# Patient Record
Sex: Female | Born: 1965 | Race: White | Hispanic: No | Smoking: Never smoker
Health system: Southern US, Community
[De-identification: ages and names within clinical notes are randomized; demographics above are authoritative.]

## PROBLEM LIST (undated history)

## (undated) ENCOUNTER — Ambulatory Visit

## (undated) ENCOUNTER — Encounter
Attending: Student in an Organized Health Care Education/Training Program | Primary: Student in an Organized Health Care Education/Training Program

## (undated) ENCOUNTER — Encounter

## (undated) ENCOUNTER — Ambulatory Visit: Attending: Family Medicine | Primary: Family Medicine

## (undated) ENCOUNTER — Ambulatory Visit: Attending: Pharmacist | Primary: Pharmacist

## (undated) ENCOUNTER — Telehealth

## (undated) ENCOUNTER — Ambulatory Visit: Payer: PRIVATE HEALTH INSURANCE

## (undated) ENCOUNTER — Ambulatory Visit
Payer: PRIVATE HEALTH INSURANCE | Attending: Student in an Organized Health Care Education/Training Program | Primary: Student in an Organized Health Care Education/Training Program

## (undated) ENCOUNTER — Ambulatory Visit: Payer: BLUE CROSS/BLUE SHIELD

## (undated) ENCOUNTER — Ambulatory Visit
Payer: PRIVATE HEALTH INSURANCE | Attending: Rehabilitative and Restorative Service Providers" | Primary: Rehabilitative and Restorative Service Providers"

## (undated) ENCOUNTER — Ambulatory Visit: Payer: PRIVATE HEALTH INSURANCE | Attending: Medical | Primary: Medical

## (undated) ENCOUNTER — Ambulatory Visit: Payer: PRIVATE HEALTH INSURANCE | Attending: Nurse Practitioner | Primary: Nurse Practitioner

## (undated) ENCOUNTER — Encounter: Payer: PRIVATE HEALTH INSURANCE | Attending: Family | Primary: Family

---

## 2005-09-13 ENCOUNTER — Emergency Department: Payer: Self-pay | Admitting: General Practice

## 2005-09-13 ENCOUNTER — Other Ambulatory Visit: Payer: Self-pay

## 2007-08-19 ENCOUNTER — Ambulatory Visit: Payer: Self-pay

## 2008-12-08 ENCOUNTER — Ambulatory Visit: Payer: Self-pay

## 2011-07-11 ENCOUNTER — Ambulatory Visit: Payer: Self-pay

## 2011-08-16 ENCOUNTER — Ambulatory Visit: Payer: Self-pay | Admitting: Family Medicine

## 2014-01-11 IMAGING — MG MM DIGITAL SCREENING BILAT W/ CAD
1 series · 4 of 4 positions shown · non-contrast
Comparison: none

REASON FOR EXAM: screening
COMMENTS:

PROCEDURE:     MAM - MAM [REDACTED] DIG SCREEN MAM W/CAD  - July 11, 2011 [DATE]
RESULT:

[Series 1262: R CC · right · 4 of 4 slices shown]
[im 1/4]
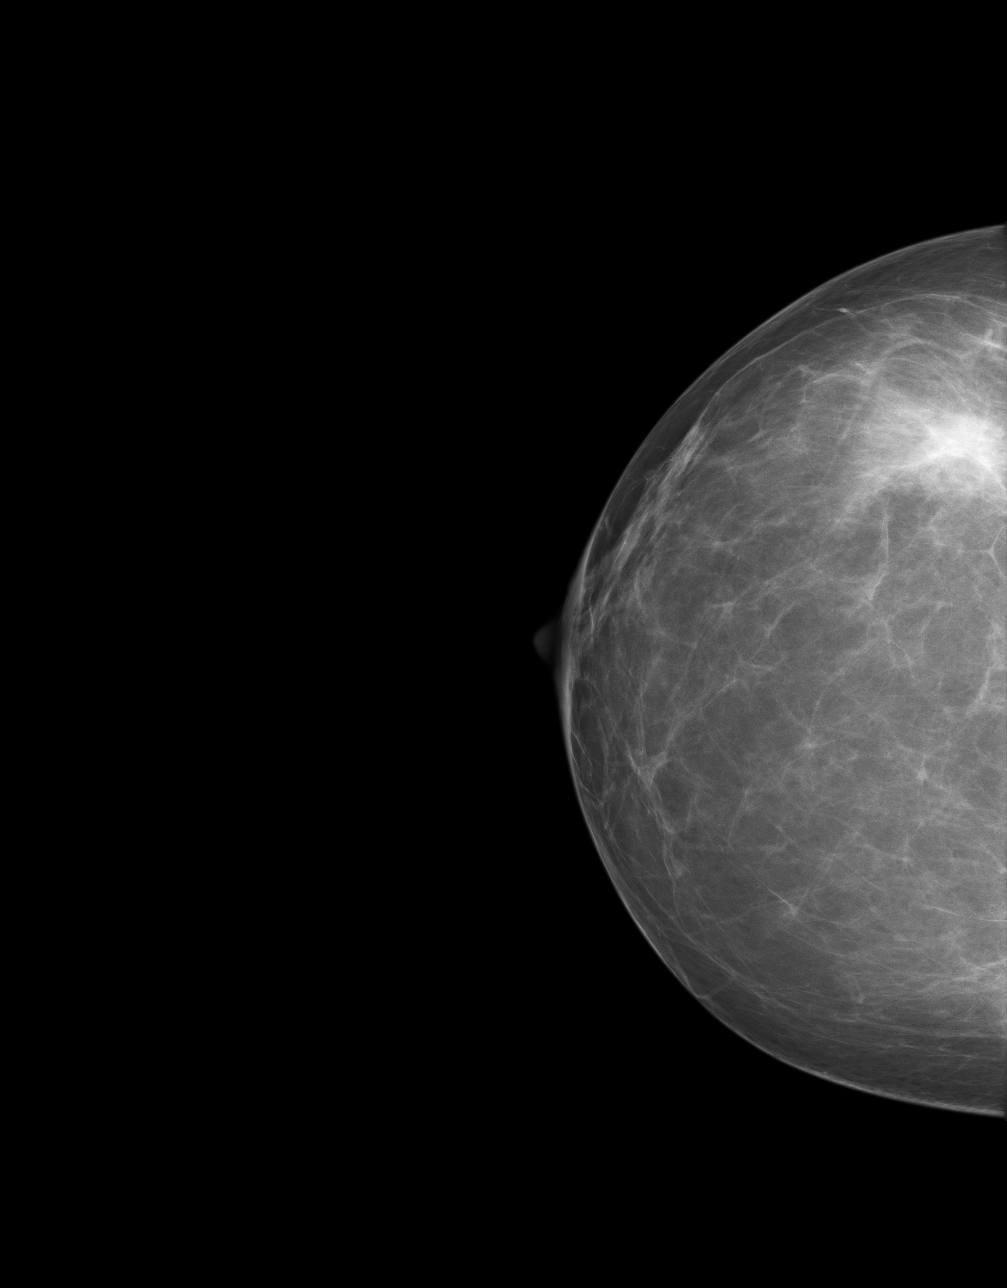
[im 2/4]
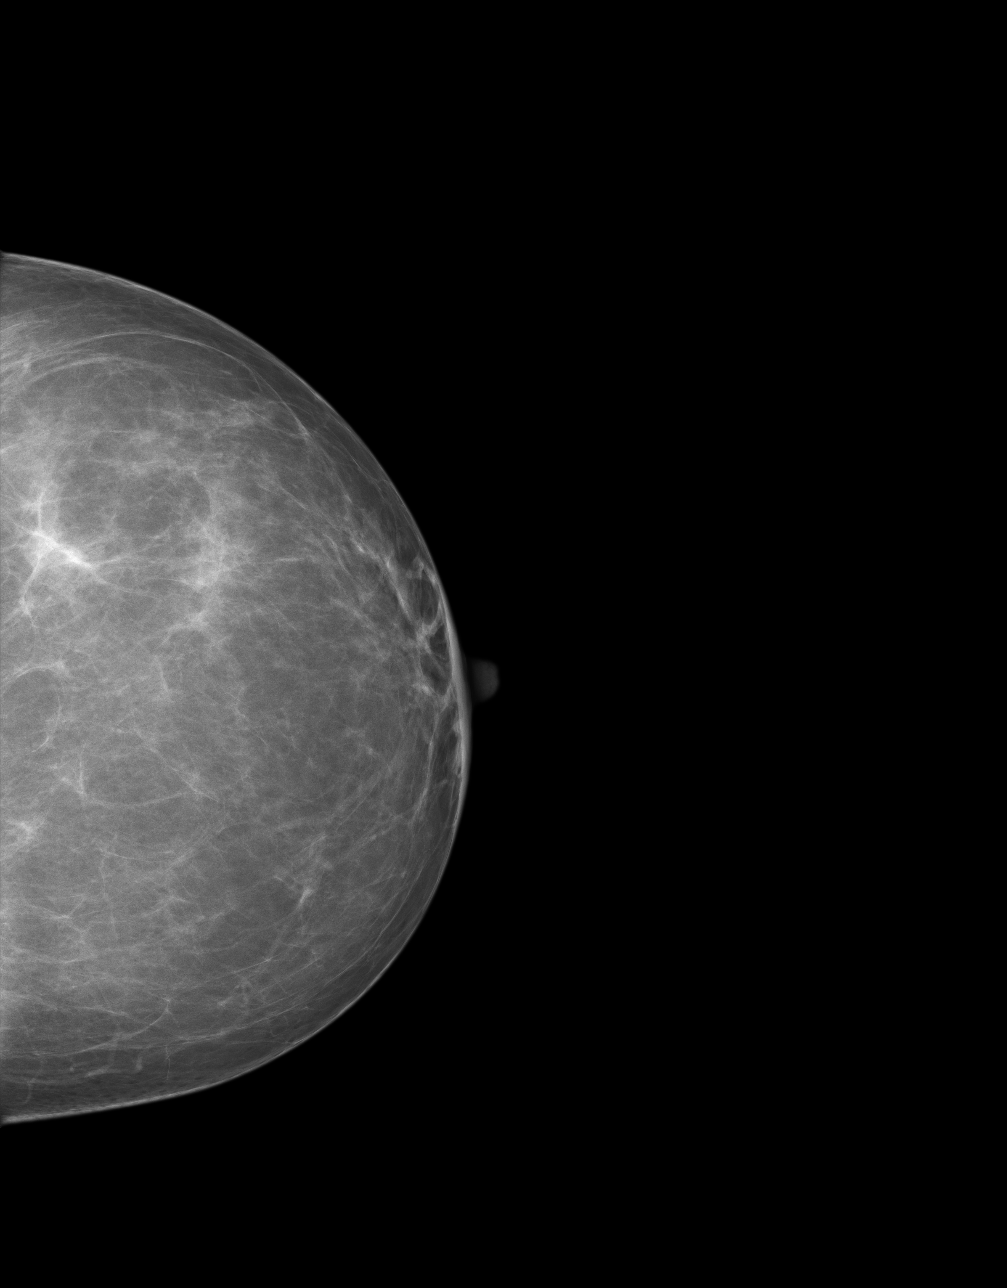
[im 3/4]
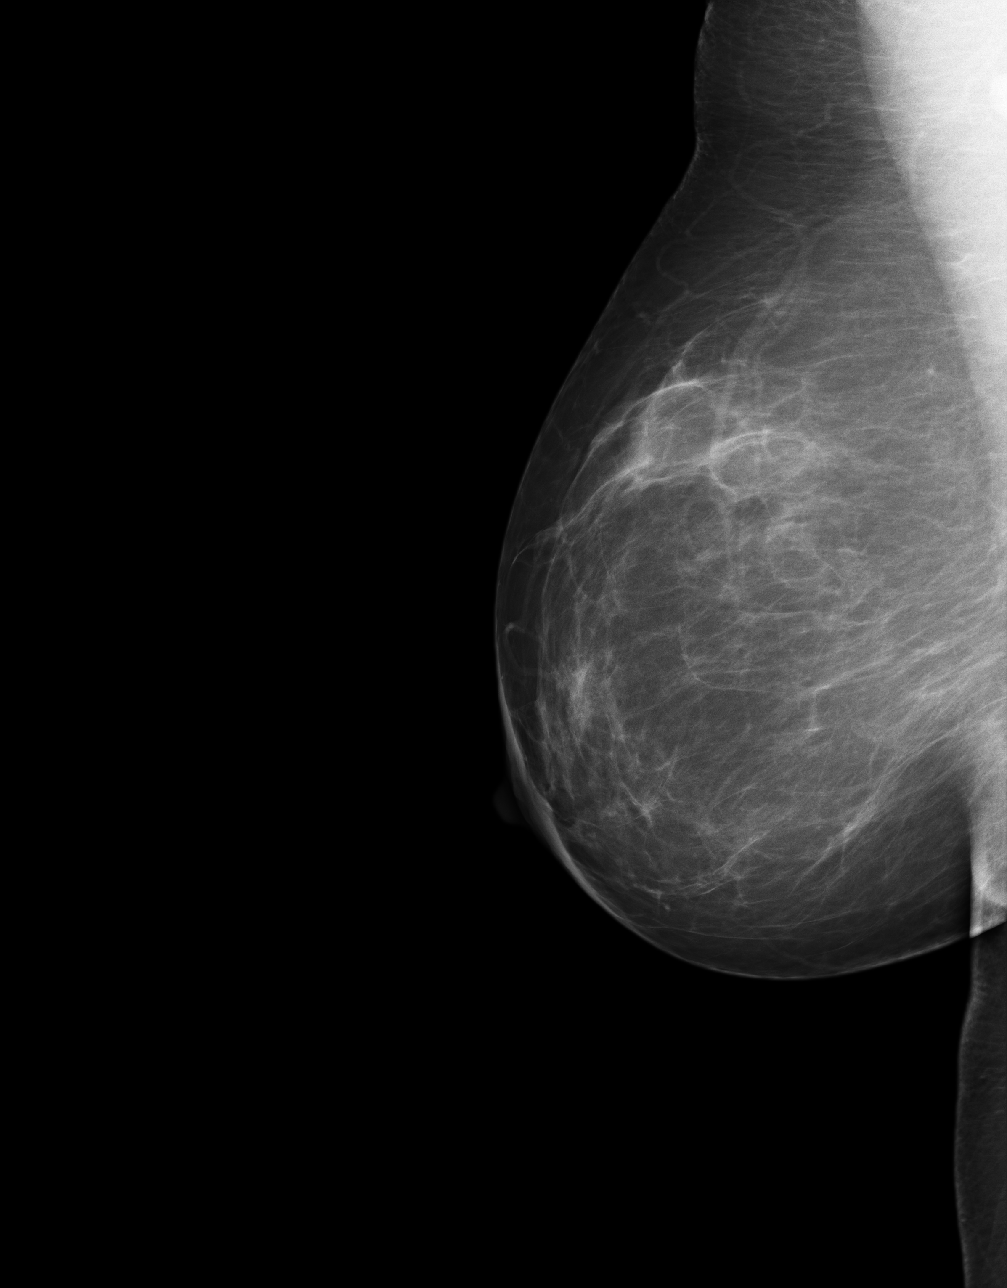
[im 4/4]
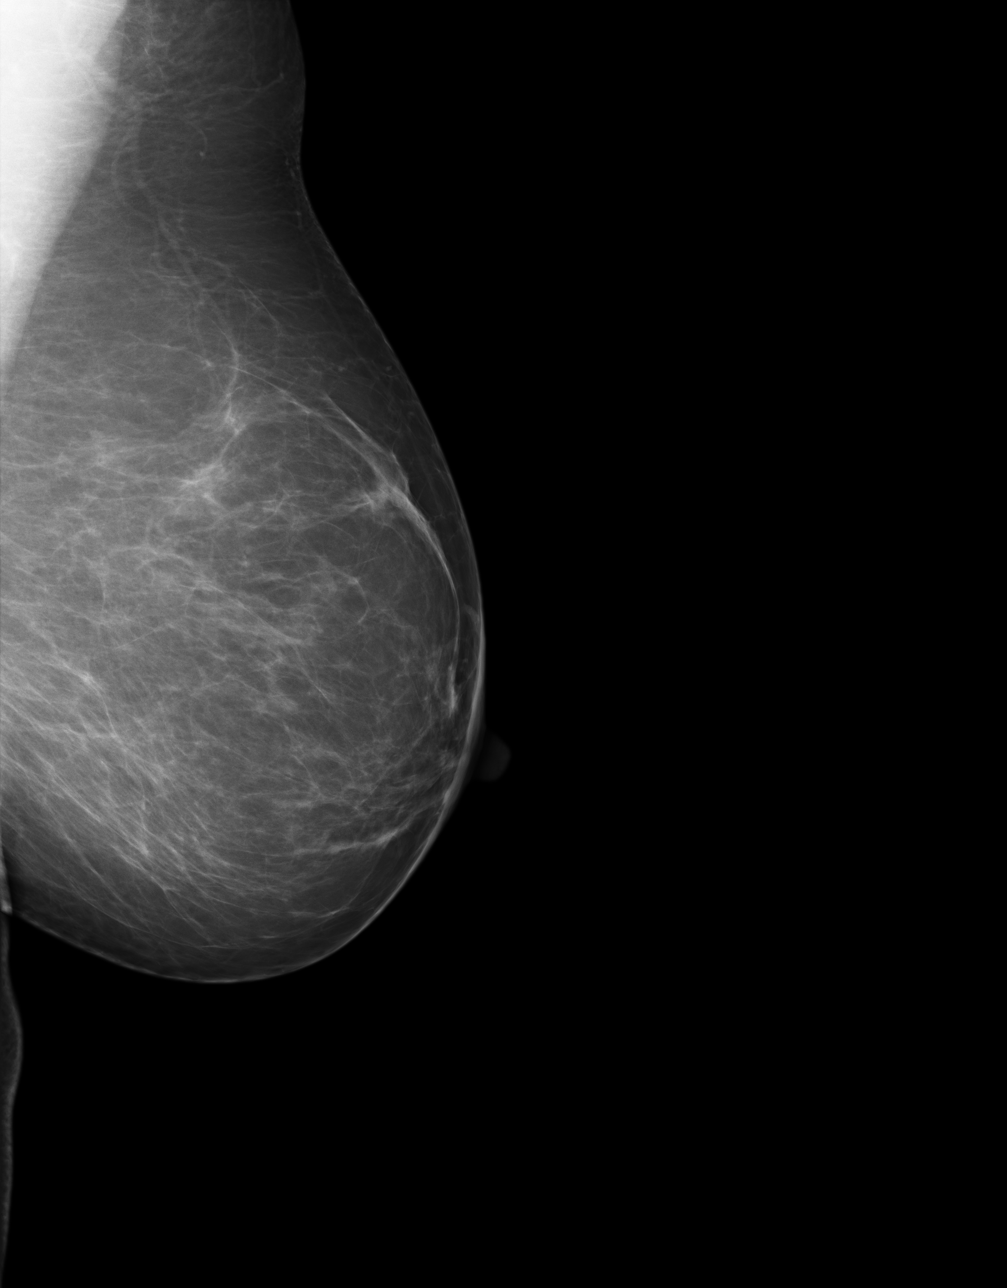

[4 of 4 positions shown; findings below may reference images not displayed]

FINDINGS: There is a family history of breast cancer in the patient's
mother.

Comparison is made to previous digital mammographic images dated 12/08/2008
as well as 08/19/2007.

The breasts exhibit a mildly dense parenchymal pattern without demonstrating
an area of developing parenchymal density or dominant mass. No malignant
appearing calcification or architectural distortion is evident. The
mammographic appearance is stable.
IMPRESSION: Stable, benign appearing bilateral mammogram.

BI-RADS: Category 2 - Benign Findings

RECOMMENDATION:  Please continue to encourage annual mammographic follow-up.

Thank you for this opportunity to contribute to the care of your patient.

A NEGATIVE MAMMOGRAM REPORT DOES NOT PRECLUDE BIOPSY OR OTHER EVALUATION OF
A CLINICALLY PALPABLE OR OTHERWISE SUSPICIOUS MASS OR LESION. BREAST CANCER
MAY NOT BE DETECTED BY MAMMOGRAPHY IN UP TO 10% OF CASES.

## 2015-09-13 ENCOUNTER — Other Ambulatory Visit: Payer: Self-pay | Admitting: Family Medicine

## 2015-09-13 DIAGNOSIS — Z1231 Encounter for screening mammogram for malignant neoplasm of breast: Secondary | ICD-10-CM

## 2015-09-27 ENCOUNTER — Ambulatory Visit
Admission: RE | Admit: 2015-09-27 | Discharge: 2015-09-27 | Disposition: A | Discharge status: Home / Self Care | Payer: PRIVATE HEALTH INSURANCE | Source: Ambulatory Visit | Attending: Family Medicine | Admitting: Family Medicine

## 2015-09-27 DIAGNOSIS — Z1231 Encounter for screening mammogram for malignant neoplasm of breast: Secondary | ICD-10-CM | POA: Diagnosis present

## 2016-01-25 ENCOUNTER — Other Ambulatory Visit: Payer: Self-pay | Admitting: Family Medicine

## 2016-01-25 DIAGNOSIS — R945 Abnormal results of liver function studies: Secondary | ICD-10-CM

## 2016-02-01 ENCOUNTER — Ambulatory Visit
Admission: RE | Admit: 2016-02-01 | Discharge: 2016-02-01 | Disposition: A | Discharge status: Home / Self Care | Payer: PRIVATE HEALTH INSURANCE | Source: Ambulatory Visit | Attending: Family Medicine | Admitting: Family Medicine

## 2016-02-01 DIAGNOSIS — R945 Abnormal results of liver function studies: Secondary | ICD-10-CM | POA: Diagnosis present

## 2016-02-01 DIAGNOSIS — K760 Fatty (change of) liver, not elsewhere classified: Secondary | ICD-10-CM | POA: Diagnosis not present

## 2017-11-05 ENCOUNTER — Ambulatory Visit: Payer: Self-pay

## 2017-12-10 ENCOUNTER — Ambulatory Visit: Payer: Self-pay | Attending: Oncology

## 2018-04-29 ENCOUNTER — Encounter: Payer: Self-pay | Admitting: *Deleted

## 2018-04-29 ENCOUNTER — Ambulatory Visit
Admission: RE | Admit: 2018-04-29 | Discharge: 2018-04-29 | Disposition: A | Discharge status: Home / Self Care | Payer: Self-pay | Source: Ambulatory Visit | Attending: Oncology | Admitting: Oncology

## 2018-04-29 ENCOUNTER — Encounter (INDEPENDENT_AMBULATORY_CARE_PROVIDER_SITE_OTHER): Payer: Self-pay

## 2018-04-29 ENCOUNTER — Ambulatory Visit: Payer: Self-pay | Attending: Oncology | Admitting: *Deleted

## 2018-04-29 VITALS — BP 136/78 | HR 85 | Temp 98.0°F | Ht 65.5 in | Wt 203.5 lb

## 2018-04-29 DIAGNOSIS — Z0000 Encounter for general adult medical examination without abnormal findings: Secondary | ICD-10-CM

## 2018-04-29 NOTE — Patient Instructions (Signed)
Gave patient hand-out, Women Staying Healthy, Active and Well from [facility], with education on breast health, pap smears, heart and colon health. 

## 2018-04-29 NOTE — Progress Notes (Signed)
  Subjective:     Patient ID: Veronica Rodgers, female   DOB: 10/29/1965, 52 y.o.   MRN: [id number]  HPI   Review of Systems     Objective:   Physical Exam Chest:     Breasts:        Right: No swelling, bleeding, inverted nipple, mass, nipple discharge, skin change or tenderness.        Left: No swelling, bleeding, inverted nipple, mass, nipple discharge, skin change or tenderness.  Abdominal:     Palpations: There is no hepatomegaly or splenomegaly.  Genitourinary:    Labia:        Right: No rash, tenderness, lesion or injury.        Left: No rash, tenderness, lesion or injury.            Assessment:     52 year old White female [facility]  Clinical breast exam unremarkable.  Taught self breast awareness.  Specimen collected for pap smear.  Patient has been screened for eligibility.  She does not have any insurance, Medicare or Medicaid.  She also meets financial eligibility.  Hand-out given on the Affordable Care Act. Risk Assessment    Risk Scores      04/29/2018   Last edited by: Alta Corningover, Melissa G, CMA   5-year risk: 2 %   Lifetime risk: 15.3 %            Plan:     Screening mammogram ordered.  Specimen for pap sent to the lab.  Will follow up per [facility] protocol.

## 2018-05-03 LAB — PAP LB AND HPV HIGH-RISK: HPV, high-risk: POSITIVE — AB

## 2018-05-10 ENCOUNTER — Encounter: Payer: Self-pay | Admitting: *Deleted

## 2018-05-10 NOTE — Progress Notes (Addendum)
Patient has abnormal pap of HPV+ LGSIL.  Left patient a message to return my call.  I would like to schedule her to GYN consult for colposcopy and biopsy.  Patient returned my call.  She is scheduled [facility]/18 @ 3:00.

## 2018-05-23 ENCOUNTER — Ambulatory Visit (INDEPENDENT_AMBULATORY_CARE_PROVIDER_SITE_OTHER): Payer: Self-pay | Admitting: Obstetrics and Gynecology

## 2018-05-23 ENCOUNTER — Other Ambulatory Visit (HOSPITAL_COMMUNITY)
Admission: RE | Admit: 2018-05-23 | Discharge: 2018-05-23 | Disposition: A | Discharge status: Home / Self Care | Payer: Self-pay | Source: Ambulatory Visit | Attending: Obstetrics and Gynecology | Admitting: Obstetrics and Gynecology

## 2018-05-23 ENCOUNTER — Encounter: Payer: Self-pay | Admitting: Obstetrics and Gynecology

## 2018-05-23 VITALS — BP 160/82 | Ht 67.0 in | Wt 203.0 lb

## 2018-05-23 DIAGNOSIS — N870 Mild cervical dysplasia: Secondary | ICD-10-CM

## 2018-05-23 DIAGNOSIS — R87612 Low grade squamous intraepithelial lesion on cytologic smear of cervix (LGSIL): Secondary | ICD-10-CM | POA: Insufficient documentation

## 2018-05-23 HISTORY — PX: COLPOSCOPY: SHX161

## 2018-05-23 NOTE — Progress Notes (Signed)
   GYNECOLOGY CLINIC COLPOSCOPY PROCEDURE NOTE  53 y.o. [id number] here for colposcopy for low-grade squamous intraepithelial neoplasia (LGSIL - encompassing HPV,mild dysplasia,CIN I)  pap smear on 04/29/18. Discussed underlying role for HPV infection in the development of cervical dysplasia, its natural history and progression/regression, need for surveillance.  She reports her last pap more than 5 years ago, can not recall exactly. She may of had cryotherapy in the past. Denies a prior LEEP or CONE.  Is the patient  pregnant: No LMP: No LMP recorded. Patient is postmenopausal. Smoking status:  reports that she has never smoked. She has never used smokeless tobacco. Contraception: none Future fertility desired:  No  Patient given informed consent, signed copy in the chart, time out was performed.  The patient was position in dorsal lithotomy position. Speculum was placed the cervix was visualized.   After application of acetic acid colposcopic inspection of the cervix was undertaken.   Colposcopy adequate, full visualization of transformation zone: No acetowhite lesion(s) noted at 11 and 5 o'clock; corresponding biopsies obtained.   ECC specimen obtained:  Yes  All specimens were labeled and sent to pathology.   Patient was given post procedure instructions.  Will follow up pathology and manage accordingly.  Routine preventative health maintenance measures emphasized.  Physical Exam Genitourinary:        Adelene Idler MD [facility], [facility] 05/23/2018 4:39 PM

## 2018-05-28 NOTE — Progress Notes (Signed)
Called and discussed with patient, will need pap smear in 1 year. CIN 1.

## 2018-06-04 ENCOUNTER — Encounter: Payer: Self-pay | Admitting: *Deleted

## 2018-06-13 ENCOUNTER — Ambulatory Visit
Admit: 2018-06-13 | Discharge: 2018-06-14 | Attending: Student in an Organized Health Care Education/Training Program | Primary: Student in an Organized Health Care Education/Training Program

## 2018-06-13 DIAGNOSIS — H04123 Dry eye syndrome of bilateral lacrimal glands: Secondary | ICD-10-CM

## 2018-06-13 DIAGNOSIS — H18603 Keratoconus, unspecified, bilateral: Principal | ICD-10-CM

## 2018-06-24 ENCOUNTER — Ambulatory Visit: Admit: 2018-06-24 | Discharge: 2018-06-25

## 2019-04-15 ENCOUNTER — Other Ambulatory Visit: Payer: Self-pay | Admitting: Family Medicine

## 2019-04-15 DIAGNOSIS — Z1231 Encounter for screening mammogram for malignant neoplasm of breast: Secondary | ICD-10-CM

## 2019-04-28 ENCOUNTER — Encounter: Payer: Self-pay | Admitting: *Deleted

## 2019-04-28 NOTE — Progress Notes (Signed)
Called patient to prescreen for her [facility] appointment.  Left message for her to return my call.

## 2019-04-30 ENCOUNTER — Ambulatory Visit: Payer: Self-pay

## 2019-10-07 ENCOUNTER — Ambulatory Visit: Admit: 2019-10-07 | Discharge: 2019-10-08 | Payer: PRIVATE HEALTH INSURANCE

## 2020-01-17 ENCOUNTER — Ambulatory Visit: Admit: 2020-01-17 | Discharge: 2020-01-19 | Payer: PRIVATE HEALTH INSURANCE

## 2020-01-21 ENCOUNTER — Ambulatory Visit
Admission: RE | Admit: 2020-01-21 | Discharge: 2020-01-21 | Disposition: A | Discharge status: Home / Self Care | Payer: BLUE CROSS/BLUE SHIELD | Source: Ambulatory Visit | Attending: Family Medicine | Admitting: Family Medicine

## 2020-01-21 DIAGNOSIS — Z1231 Encounter for screening mammogram for malignant neoplasm of breast: Secondary | ICD-10-CM | POA: Insufficient documentation

## 2020-01-23 ENCOUNTER — Other Ambulatory Visit: Payer: Self-pay | Admitting: Family Medicine

## 2020-01-23 DIAGNOSIS — N644 Mastodynia: Secondary | ICD-10-CM

## 2020-02-02 ENCOUNTER — Other Ambulatory Visit: Payer: BLUE CROSS/BLUE SHIELD

## 2020-02-09 ENCOUNTER — Other Ambulatory Visit: Payer: BLUE CROSS/BLUE SHIELD

## 2020-02-18 ENCOUNTER — Ambulatory Visit: Payer: BLUE CROSS/BLUE SHIELD | Attending: Family Medicine

## 2020-02-18 ENCOUNTER — Other Ambulatory Visit: Payer: BLUE CROSS/BLUE SHIELD

## 2020-06-23 ENCOUNTER — Other Ambulatory Visit: Payer: BLUE CROSS/BLUE SHIELD

## 2020-07-21 ENCOUNTER — Other Ambulatory Visit: Payer: BLUE CROSS/BLUE SHIELD

## 2020-07-28 DIAGNOSIS — H9203 Otalgia, bilateral: Principal | ICD-10-CM

## 2020-10-08 ENCOUNTER — Other Ambulatory Visit: Payer: BLUE CROSS/BLUE SHIELD

## 2020-10-08 ENCOUNTER — Inpatient Hospital Stay: Admission: RE | Admit: 2020-10-08 | Payer: BLUE CROSS/BLUE SHIELD | Source: Ambulatory Visit

## 2020-11-26 ENCOUNTER — Encounter: Admit: 2020-11-26 | Payer: PRIVATE HEALTH INSURANCE | Attending: Audiologist | Primary: Audiologist

## 2020-12-01 ENCOUNTER — Other Ambulatory Visit: Payer: BLUE CROSS/BLUE SHIELD

## 2020-12-01 ENCOUNTER — Inpatient Hospital Stay: Admission: RE | Admit: 2020-12-01 | Payer: BLUE CROSS/BLUE SHIELD | Source: Ambulatory Visit

## 2020-12-14 ENCOUNTER — Ambulatory Visit
Admission: RE | Admit: 2020-12-14 | Discharge: 2020-12-14 | Disposition: A | Discharge status: Home / Self Care | Payer: BLUE CROSS/BLUE SHIELD | Source: Ambulatory Visit | Attending: Family Medicine | Admitting: Family Medicine

## 2020-12-14 ENCOUNTER — Other Ambulatory Visit: Payer: Self-pay

## 2020-12-14 DIAGNOSIS — N644 Mastodynia: Secondary | ICD-10-CM

## 2020-12-31 DIAGNOSIS — L409 Psoriasis, unspecified: Principal | ICD-10-CM

## 2020-12-31 DIAGNOSIS — M2560 Stiffness of unspecified joint, not elsewhere classified: Principal | ICD-10-CM

## 2020-12-31 DIAGNOSIS — H9190 Unspecified hearing loss, unspecified ear: Principal | ICD-10-CM

## 2020-12-31 DIAGNOSIS — R5383 Other fatigue: Principal | ICD-10-CM

## 2021-01-13 ENCOUNTER — Ambulatory Visit: Admit: 2021-01-13 | Discharge: 2021-01-14 | Payer: PRIVATE HEALTH INSURANCE

## 2021-01-13 DIAGNOSIS — M545 Chronic bilateral low back pain without sciatica: Principal | ICD-10-CM

## 2021-01-13 DIAGNOSIS — G8929 Other chronic pain: Principal | ICD-10-CM

## 2021-01-13 MED ORDER — METHOCARBAMOL 500 MG TABLET
ORAL_TABLET | Freq: Every evening | ORAL | 0 refills | 30 days | Status: CP | PRN
Start: 2021-01-13 — End: ?

## 2021-02-24 ENCOUNTER — Ambulatory Visit
Admit: 2021-02-24 | Payer: PRIVATE HEALTH INSURANCE | Attending: Audiologist-Hearing Aid Fitter | Primary: Audiologist-Hearing Aid Fitter

## 2021-03-09 ENCOUNTER — Ambulatory Visit: Admit: 2021-03-09 | Discharge: 2021-03-09 | Payer: PRIVATE HEALTH INSURANCE

## 2021-03-09 ENCOUNTER — Ambulatory Visit
Admit: 2021-03-09 | Discharge: 2021-03-09 | Payer: PRIVATE HEALTH INSURANCE | Attending: Student in an Organized Health Care Education/Training Program | Primary: Student in an Organized Health Care Education/Training Program

## 2021-03-09 DIAGNOSIS — L409 Psoriasis, unspecified: Principal | ICD-10-CM

## 2021-03-09 DIAGNOSIS — L4050 Arthropathic psoriasis, unspecified: Principal | ICD-10-CM

## 2021-03-09 DIAGNOSIS — Z79899 Other long term (current) drug therapy: Principal | ICD-10-CM

## 2021-03-09 MED ORDER — BETAMETHASONE DIPROPIONATE 0.05 % TOPICAL OINTMENT
Freq: Two times a day (BID) | TOPICAL | 2 refills | 0.00000 days | Status: CP | PRN
Start: 2021-03-09 — End: 2022-03-09

## 2021-03-11 DIAGNOSIS — L409 Psoriasis, unspecified: Principal | ICD-10-CM

## 2021-03-11 MED ORDER — HUMIRA PEN CITRATE FREE STARTER PACK FOR PS/UV 1X 80 MG/0.8 ML, 2X 40 MG/0.4 ML
ORAL | 1 refills | 0.00000 days | Status: CP
Start: 2021-03-11 — End: ?

## 2021-03-14 DIAGNOSIS — L409 Psoriasis, unspecified: Principal | ICD-10-CM

## 2021-03-14 MED ORDER — HUMIRA PEN CITRATE FREE 40 MG/0.4 ML
SUBCUTANEOUS | 3 refills | 28.00000 days | Status: CP
Start: 2021-03-14 — End: ?
  Filled 2021-03-29: qty 3, 30d supply, fill #0

## 2021-03-15 DIAGNOSIS — L409 Psoriasis, unspecified: Principal | ICD-10-CM

## 2021-03-25 NOTE — Unmapped (Signed)
[facility] Specialty Medication Onboarding    Specialty Medication: Humira starter kit and maintenance  Prior Authorization: Approved   Financial Assistance: Yes - copay card approved as secondary   Final Copay/Day Supply: $5 / 30 days (load) $5/ 28 days (maintenance)     Insurance Restrictions: Yes - max 1 month supply     Notes to Pharmacist:     The triage team has completed the benefits investigation and has determined that the patient is able to fill this medication at [facility]. Please contact the patient to complete the onboarding or follow up with the prescribing physician as needed.

## 2021-03-25 NOTE — Unmapped (Signed)
*in addition to reviewing the information below, I also sent a link to an injection training video to the patient's MyChart.    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  I am speaking to the patient.    Was a Nurse, learning disability used for this call? No    Verified patient's date of birth / HIPAA.    Specialty medication(s) to be sent: Inflammatory Disorders: Humira      Non-specialty medications/supplies to be sent: Sharps container      Medications not needed at this time: na         Humira (adalimumab)    Medication & Administration     Dosage:  Psoriasis: Inject 80mg  under the skin on day 1, then 40mg  every 14 days starting on day 8    Lab tests required prior to treatment initiation:  ??? Tuberculosis: Tuberculosis screening resulted in a non-reactive Quantiferon TB Gold assay.  ??? Hepatitis B: Hepatitis B serology studies are complete and non-reactive.    Administration:     Prefilled auto-injector pen  1. Gather all supplies needed for injection on a clean, flat working surface: medication pen removed from packaging, alcohol swab, sharps container, etc.  2. Look at the medication label - look for correct medication, correct dose, and check the expiration date  3. Look at the medication - the liquid visible in the window on the side of the pen device should appear clear and colorless  4. Lay the auto-injector pen on a flat surface and allow it to warm up to room temperature for at least 30-45 minutes  5. Select injection site - you can use the front of your thigh or your belly (but not the area 2 inches around your belly button); if someone else is giving you the injection you can also use your upper arm in the skin covering your triceps muscle  6. Prepare injection site - wash your hands and clean the skin at the injection site with an alcohol swab and let it air dry, do not touch the injection site again before the injection  7. Pull the 2 safety caps straight off - gray/white to uncover the needle cover and the plum cap to uncover the plum activator button, do not remove until immediately prior to injection and do not touch the white needle cover  8. Gently squeeze the area of cleaned skin and hold it firmly to create a firm surface at the selected injection site  9. Put the white needle cover against your skin at the injection site at a 90 degree angle, hold the pen such that you can see the clear medication window  10. Press down and hold the pen firmly against your skin, press the plum activator button to initiate the injection, there will be a click when the injection starts  11. Continue to hold the pen firmly against your skin for about 10-15 seconds - the window will start to turn solid yellow  12. To verify the injection is complete after 10-15 seconds, look and ensure the window is solid yellow and then pull the pen away from your skin  13. Dispose of the used auto-injector pen immediately in your sharps disposal container the needle will be covered automatically  14. If you see any blood at the injection site, press a cotton ball or gauze on the site  and maintain pressure until the bleeding stops, do not rub the injection site    Adherence/Missed dose instructions:  If your injection is given more than 3 days after your scheduled injection date - consult your pharmacist for additional instructions on how to adjust your dosing schedule.    Goals of Therapy     - Achieve remission/inactive disease or low/minimal disease activity  - Maintenance of function  - Minimization of systemic manifestations and comorbidities  - Maintenance of effective psychosocial functioning    Side Effects & Monitoring Parameters     ??? Injection site reaction (redness, irritation, inflammation localized to the site of administration)  ??? Signs of a common cold - minor sore throat, runny or stuffy nose, etc.  ??? Upset stomach  ??? Headache    The following side effects should be reported to the provider:  ??? Signs of a hypersensitivity reaction - rash; hives; itching; red, swollen, blistered, or peeling skin; wheezing; tightness in the chest or throat; difficulty breathing, swallowing, or talking; swelling of the mouth, face, lips, tongue, or throat; etc.  ??? Reduced immune function - report signs of infection such as fever; chills; body aches; very bad sore throat; ear or sinus pain; cough; more sputum or change in color of sputum; pain with passing urine; wound that will not heal, etc.  Also at a slightly higher risk of some malignancies (mainly skin and blood cancers) due to this reduced immune function.  o In the case of signs of infection - the patient should hold the next dose of Humira?? and call your primary care provider to ensure adequate medical care.  Treatment may be resumed when infection is treated and patient is asymptomatic.  ??? Changes in skin - a new growth or lump that forms; changes in shape, size, or color of a previous mole or marking  ??? Signs of unexplained bruising or bleeding - throwing up blood or emesis that looks like coffee grounds; black, tarry, or bloody stool; etc.  ??? Signs of new or worsening heart failure - shortness of breath; sudden weight gain; heartbeat that is not normal; swelling in the arms or legs that is new or worse      Contraindications, Warnings, & Precautions     ??? Have your bloodwork checked as you have been told by your prescriber  ??? Talk with your doctor if you are pregnant, planning to become pregnant, or breastfeeding  ??? Discuss the possible need for holding your dose(s) of Humira?? when a planned procedure is scheduled with the prescriber as it may delay healing/recovery timeline       Drug/Food Interactions     ??? Medication list reviewed in Epic. The patient was instructed to inform the care team before taking any new medications or supplements. No drug interactions identified.   ??? Talk with you prescriber or pharmacist before receiving any live vaccinations while taking this medication and after you stop taking it    Storage, Handling Precautions, & Disposal     ??? Store this medication in the refrigerator.  Do not freeze  ??? If needed, you may store at room temperature for up to 14 days  ??? Store in original packaging, protected from light  ??? Do not shake  ??? Dispose of used syringes/pens in a sharps disposal container              Current Medications (including OTC/herbals), Comorbidities and Allergies     Current Outpatient Medications   Medication Sig  Dispense Refill   ??? acetaminophen (TYLENOL) 500 MG tablet Take 500 mg by mouth every four (4) hours as needed for pain.     ??? amLODIPine (NORVASC) 5 MG tablet Take 5 mg by mouth.     ??? betamethasone dipropionate (DIPROLENE) 0.05 % ointment Apply topically two (2) times a day as needed. 45 g 2   ??? betamethasone valerate (VALISONE) 0.1 % cream Apply topically Two (2) times a day.     ??? fluconazole (DIFLUCAN) 150 MG tablet TAKE 1 TABLET BY MOUTH 1 TIME A WEEK     ??? HUMIRA PEN CITRATE FREE 40 MG/0.4 ML Inject the contents of 1 pen (40 mg total) under the skin every fourteen (14) days. 2 each 3   ??? HUMIRA PEN CITRATE FREE STARTER PACK FOR PS/UV 1X 80 MG/0.8 ML, 2X 40 MG/0.4 ML Inject the contents of 1 pen (80 mg) on day 1, then 1 pen (40 mg) every other week beginning 1 week after initial dose 3 each 1   ??? ibuprofen (ADVIL,MOTRIN) 800 MG tablet Take 1 tablet (800 mg total) by mouth every eight (8) hours as needed for pain. 30 tablet 0     No current facility-administered medications for this visit.       Allergies   Allergen Reactions   ??? Septa Itching     Breathing trouble       Patient Active Problem List   Diagnosis   ??? Sleep apnea       Reviewed and up to date in Epic.    Appropriateness of Therapy     Acute infections noted within Epic:  No active infections  Patient reported infection: None    Is medication and dose appropriate based on diagnosis and infection status? Yes    Prescription has been clinically reviewed: Yes      Baseline Quality of Life Assessment      How many days over the past month did your psoriasis  keep you from your normal activities? For example, brushing your teeth or getting up in the morning. 0    Financial Information     Medication Assistance provided: Prior Authorization and Copay Assistance    Anticipated copay of $5/30 days reviewed with patient. Verified delivery address.    Delivery Information     Scheduled delivery date: Tues, 11/15    Expected start date: Tues, 11/15    Medication will be delivered via Same Day Courier to the prescription address in [facility].  This shipment will not require a signature.      Explained the services we provide at [facility] and that each month we would call to set up refills.  Stressed importance of returning phone calls so that we could ensure they receive their medications in time each month.  Informed patient that we should be setting up refills 7-10 days prior to when they will run out of medication.  A pharmacist will reach out to perform a clinical assessment periodically.  Informed patient that a welcome packet, containing information about our pharmacy and other support services, a Notice of Privacy Practices, and a drug information handout will be sent.      The patient or caregiver noted above participated in the development of this care plan and knows that they can request review of or adjustments to the care plan at any time.      Patient or caregiver verbalized understanding of the above information as well as how to  contact the pharmacy at [phone] option 4 with any questions/concerns.  The pharmacy is open Monday through Friday 8:30am-4:30pm.  A pharmacist is available 24/7 via pager to answer any clinical questions they may have.    Patient Specific Needs     - Does the patient have any physical, cognitive, or cultural barriers? No    - Does the patient have adequate living arrangements? (i.e. the ability to store and take their medication appropriately) Yes    - Did you identify any home environmental safety or security hazards? No    - Patient prefers to have medications discussed with  Patient     - Is the patient or caregiver able to read and understand education materials at a high school level or above? Yes    - Patient's primary language is  English     - Is the patient high risk? No    - Does the patient require physician intervention or other additional services (i.e. dietary/nutrition, smoking cessation, social work)? No      Meghan A Desiree Lucy [facility] Specialty Pharmacist

## 2021-03-28 MED ORDER — EMPTY CONTAINER
2 refills | 0 days
Start: 2021-03-28 — End: ?

## 2021-03-29 MED FILL — EMPTY CONTAINER: 120 days supply | Qty: 1 | Fill #0

## 2021-03-31 DIAGNOSIS — M545 Chronic bilateral low back pain without sciatica: Principal | ICD-10-CM

## 2021-03-31 DIAGNOSIS — G8929 Other chronic pain: Principal | ICD-10-CM

## 2021-03-31 MED ORDER — METHOCARBAMOL 500 MG TABLET
ORAL_TABLET | Freq: Every evening | ORAL | 0 refills | 30 days | PRN
Start: 2021-03-31 — End: ?

## 2021-03-31 NOTE — Unmapped (Signed)
PATIENT NO LONGER TAKING

## 2021-04-11 NOTE — Unmapped (Unsigned)
Otolaryngology New Consult Visit Note      Reason for Visit:  otalgia    History of Present Illness    Carol Mendoza is 55 y.o. female being seen in consultation at the request of No PCP Per Patient  for evaluation of otalgia.       The patient denies {DL Review of FUXNATFT:73220}    Past Medical History     has a past medical history of Depression, Hypertension, Pellucid marginal degeneration of both corneas, and Sleep apnea.    Past Surgical History     has no past surgical history on file.    Current Medications    Current Outpatient Medications   Medication Sig Dispense Refill   ??? acetaminophen (TYLENOL) 500 MG tablet Take 500 mg by mouth every four (4) hours as needed for pain.     ??? amLODIPine (NORVASC) 5 MG tablet Take 5 mg by mouth.     ??? betamethasone dipropionate (DIPROLENE) 0.05 % ointment Apply topically two (2) times a day as needed. 45 g 2   ??? betamethasone valerate (VALISONE) 0.1 % cream Apply topically Two (2) times a day.     ??? empty container Misc Use as directed to dispose of Humira pens. 1 each 2   ??? fluconazole (DIFLUCAN) 150 MG tablet TAKE 1 TABLET BY MOUTH 1 TIME A WEEK     ??? HUMIRA PEN CITRATE FREE 40 MG/0.4 ML Inject the contents of 1 pen (40 mg total) under the skin every fourteen (14) days. 2 each 3   ??? HUMIRA PEN CITRATE FREE STARTER PACK FOR PS/UV 1X 80 MG/0.8 ML, 2X 40 MG/0.4 ML Inject the contents of 1 pen (80 mg) on day 1, then 1 pen (40 mg) every other week beginning 1 week after initial dose 3 each 1   ??? ibuprofen (ADVIL,MOTRIN) 800 MG tablet Take 1 tablet (800 mg total) by mouth every eight (8) hours as needed for pain. 30 tablet 0     No current facility-administered medications for this visit.       Allergies    Allergies   Allergen Reactions   ??? Septa Itching     Breathing trouble       Family History    family history includes Breast cancer in her mother; Cancer in her father; Diabetes in her brother, father, and mother; Hypertension in her brother, father, and mother.    Social History:     reports that she has never smoked. She has never used smokeless tobacco.   has no history on file for alcohol use.   has no history on file for drug use.    Review of Systems    A full  review of systems is documented and negative except as noted in HPI.  Patient intake form reviewed.    Vital Signs  There were no vitals taken for this visit.    Physical Exam    General: Well-developed, well-nourished. Appropriate, comfortable, and in no apparent distress.  Head/Face: On external examination there is no obvious asymmetry or scars. On palpation there is no tenderness over maxillary sinuses or masses within the salivary glands. Cranial nerves V and VII are intact through all distributions.  Eyes: PERRL, EOMI, the conjunctiva are not injected and sclera is non-icteric.  Ears:   Right ear: On external exam, there is no obvious lesions or asymmetry. {DL URKYHCW:23762}. No lesions in the EAC. {DL GB:15176}  Left ear: On external exam, there is no obvious lesions  or asymmetry. {DL ZOXWRUE:45409}. No lesions in the EAC. {DL WJ:19147}  Hearing is {DL WGNFAOZ:30865}  Nose:  {DLNose:66445}  Oral cavity/oropharynx: The mucosa of the lips, gums, hard and soft palate, posterior pharyngeal wall, tongue, floor of mouth, and buccal region are without masses or lesions and are normally hydrated. {DLOC:66443}{DLOP:66444}Tonsils are {DLtonsil:66405}  IDL:{IDL:66217}  Voice: clear   Neck/Lymphatics: There is no asymmetry or masses. Trachea is midline. There is no enlargement of the thyroid or palpable thyroid nodules. There is no palpable lymphadenopathy along the jugulodiagastric, submental, or posterior cervical chains.  Respiratory: No audible wheeze, unlabored respirations.  Neurologic: Cranial nerve???s II-XII are grossly intact. Exam is non-focal.  Cardiovascular: No cyanosis, clubbing or edema.    Procedure  ***    Audiogram  The audiogram was reviewed. 04/21/21 This demonstrates a ***    Imaging  I personally reviewed the patients imaging studies. ***    Assessment/Recommendations:    The patient is a 56 y.o. female with a history of  has a past medical history of Depression, Hypertension, Pellucid marginal degeneration of both corneas, and Sleep apnea. who presents for the evaluation of ***.        The patient voiced complete understanding of plan as detailed above and is in full agreement.

## 2021-04-21 ENCOUNTER — Ambulatory Visit: Admit: 2021-04-21 | Payer: PRIVATE HEALTH INSURANCE

## 2021-04-21 ENCOUNTER — Ambulatory Visit: Admit: 2021-04-21 | Payer: PRIVATE HEALTH INSURANCE | Attending: Audiologist | Primary: Audiologist

## 2021-04-25 NOTE — Unmapped (Signed)
Carol Mendoza has done well on her new Humira. Her psoriasis is clearing, and she has much less pain and bleeding. She continues to use topical steroids, but has needed them less frequently.      [facility] Clinical Assessment & Refill Coordination Note    Carol Mendoza, DOB: 1965/05/27  Phone: There are no phone numbers on file.    All above HIPAA information was verified with patient.     Was a Nurse, learning disability used for this call? No    Specialty Medication(s):   Inflammatory Disorders: Humira     Current Outpatient Medications   Medication Sig Dispense Refill   ??? acetaminophen (TYLENOL) 500 MG tablet Take 500 mg by mouth every four (4) hours as needed for pain.     ??? amLODIPine (NORVASC) 5 MG tablet Take 5 mg by mouth.     ??? betamethasone dipropionate (DIPROLENE) 0.05 % ointment Apply topically two (2) times a day as needed. 45 g 2   ??? betamethasone valerate (VALISONE) 0.1 % cream Apply topically Two (2) times a day.     ??? empty container Misc Use as directed to dispose of Humira pens. 1 each 2   ??? fluconazole (DIFLUCAN) 150 MG tablet TAKE 1 TABLET BY MOUTH 1 TIME A WEEK     ??? HUMIRA PEN CITRATE FREE 40 MG/0.4 ML Inject the contents of 1 pen (40 mg total) under the skin every fourteen (14) days. 2 each 3   ??? HUMIRA PEN CITRATE FREE STARTER PACK FOR PS/UV 1X 80 MG/0.8 ML, 2X 40 MG/0.4 ML Inject the contents of 1 pen (80 mg) on day 1, then 1 pen (40 mg) every other week beginning 1 week after initial dose 3 each 1   ??? ibuprofen (ADVIL,MOTRIN) 800 MG tablet Take 1 tablet (800 mg total) by mouth every eight (8) hours as needed for pain. 30 tablet 0     No current facility-administered medications for this visit.        Changes to medications: Carol Mendoza reports no changes at this time.    Allergies   Allergen Reactions   ??? Septa Itching     Breathing trouble       Changes to allergies: No    SPECIALTY MEDICATION ADHERENCE     Humira - 0 left  Medication Adherence    Patient reported X missed doses in the last month: 0  Specialty Medication: Humira          Specialty medication(s) dose(s) confirmed: Regimen is correct and unchanged.     Are there any concerns with adherence? No    Adherence counseling provided? Not needed    CLINICAL MANAGEMENT AND INTERVENTION      Clinical Benefit Assessment:    Do you feel the medicine is effective or helping your condition? Yes    Clinical Benefit counseling provided? Not needed    Adverse Effects Assessment:    Are you experiencing any side effects? No    Are you experiencing difficulty administering your medicine? No    Quality of Life Assessment:    Quality of Life    Rheumatology  Oncology  Dermatology  1. What impact has your specialty medication had on the symptoms of your skin condition (i.e. itchiness, soreness, stinging)?: Tremendous  2. What impact has your specialty medication had on your comfort level with your skin?: Tremendous  Cystic Fibrosis          Have you discussed this with your provider? Not  needed    Acute Infection Status:    Acute infections noted within Epic:  No active infections  Patient reported infection: None    Therapy Appropriateness:    Is therapy appropriate and patient progressing towards therapeutic goals? Yes, therapy is appropriate and should be continued    DISEASE/MEDICATION-SPECIFIC INFORMATION      For patients on injectable medications: Patient currently has 0 doses left.  Next injection is scheduled for Thurs, 12/22.    PATIENT SPECIFIC NEEDS     - Does the patient have any physical, cognitive, or cultural barriers? No    - Is the patient high risk? No    Does the patient require a Care Management Plan? No   SHIPPING     Specialty Medication(s) to be Shipped:   Inflammatory Disorders: Humira    Other medication(s) to be shipped: No additional medications requested for fill at this time     Changes to insurance: No    Delivery Scheduled: Yes, Expected medication delivery date: Thurs, 12/15.     Medication will be delivered via Same Day Courier to the confirmed prescription address in [facility].    The patient will receive a drug information handout for each medication shipped and additional FDA Medication Guides as required.  Verified that patient has previously received a Conservation officer, historic buildings and a Surveyor, mining.    The patient or caregiver noted above participated in the development of this care plan and knows that they can request review of or adjustments to the care plan at any time.      All of the patient's questions and concerns have been addressed.    Lanney Gins   [facility] Specialty Pharmacist

## 2021-04-27 ENCOUNTER — Ambulatory Visit
Admit: 2021-04-27 | Payer: PRIVATE HEALTH INSURANCE | Attending: Rehabilitative and Restorative Service Providers" | Primary: Rehabilitative and Restorative Service Providers"

## 2021-04-27 NOTE — Unmapped (Unsigned)
[facility] PT [facility]  OUTPATIENT PHYSICAL THERAPY  04/27/2021          Patient Name: Carol Mendoza  Date of Birth:15-May-1966  Diagnosis: No diagnosis found.  Referring MD:  Pcp, None Per Patient     Visit #: 1    Date of Onset of Impairment-No date available  Date PT Care Plan Established or Reviewed-No date available  Date PT Treatment Started-No date available   Plan of Care Effective Date:     Assessment/Plan:    Assessment details:       55 y.o. year old female presents with {Left/Right/Bilat:46051} *** which is limiting their ability to perform the functional activities listed below.  Based on patient's presentation in clinic today, signs and symptoms appear including *** to be consistent with ***.  This patient requires skilled physical therapy services to address the outlined impairments in order to return to their desired level of function.  Impairments: pain, joint restriction, impaired flexibility, decreased strength, decreased range of motion, gait deviation, impaired ADLs and poor awareness of body mechanics                      Therapy Goals  Goals:      Goals:??  Short-Term Goals   In 6 weeks ***  1. The pt ??will demonstrate *** ??independent performance of HEP to maintain functional gains.   2. The pt ??will demonstrate ***    Long-Term Goals   In 12 weeks ***  1. The pt will demonstrate independent performance of HEP to maintain functional gains.   2. Patient to report return to *** with pain </= ***/10 to demo improved tolerance to previous activities.  3. The patient will improve in *** score to help patient demonstrate improvement in overall function.    Plan  Therapy options: will be seen for skilled physical therapy services  Planned therapy interventions: manual therapy, body mechanics training, therapeutic activities, therapeutic exercises, postural training, neuromuscular re-education, education - patient and home exercise program      Education provided to: patient.  Education provided: anatomy, body mechanics, importance of Therapy and HEP                  Subjective:   History of Present Condition        Subjective:     Ms. Mestas reports to outpatient physical therapy with a *** history of ***. Mechanism of injury is ***.     Previous treatment: ***  Since onset, symptoms are ***.     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   Patient Active Problem List:     Sleep apnea     Current Outpatient Medications:  acetaminophen (TYLENOL) 500 MG tablet, Take 500 mg by mouth every four (4) hours as needed for pain., Disp: , Rfl:   amLODIPine (NORVASC) 5 MG tablet, Take 5 mg by mouth., Disp: , Rfl:   betamethasone dipropionate (DIPROLENE) 0.05 %  ointment, Apply topically two (2) times a day as needed., Disp: 45 g, Rfl: 2  betamethasone valerate (VALISONE) 0.1 % cream, Apply topically Two (2) times a day., Disp: , Rfl:   empty container Misc, Use as directed to dispose of Humira pens., Disp: 1 each, Rfl: 2  fluconazole (DIFLUCAN) 150 MG tablet, TAKE 1 TABLET BY MOUTH 1 TIME A WEEK, Disp: , Rfl:   HUMIRA PEN CITRATE FREE 40 MG/0.4 ML, Inject the contents of 1 pen (40 mg total) under the skin every fourteen (14) days., Disp: 2 each, Rfl: 3  HUMIRA PEN CITRATE FREE STARTER PACK FOR PS/UV 1X 80 MG/0.8 ML, 2X 40 MG/0.4 ML, Inject the contents of 1 pen (80 mg) on day 1, then 1 pen (40 mg) every other week beginning 1 week after initial dose, Disp: 3 each, Rfl: 1  ibuprofen (ADVIL,MOTRIN) 800 MG tablet, Take 1 tablet (800 mg total) by mouth every eight (8) hours as needed for pain., Disp: 30 tablet, Rfl: 0    No current facility-administered medications for this visit.           Precautions and Equipment  Precautions: None  Current Braces/Orthoses: None  Equipment Currently Used: None        Treatments  Current treatment: physical therapy          Objective:   Objective    Outcome Measure: Revised Oswestry: ***    Posture/Observation:   Sitting: {KC SITTING POSTURE:45277::unremarkable}  Correction of Posture: {KC Better/worse selection:45278}  Standing: {KC Standing Posture:45279::unremarkable}  Gait: {Gait Analysis:48459::non-antalgic}    Lumbar Spine ROM  Motion No ROM Loss  (0%) Min ROM Loss  (1-25%) Mod ROM Loss  (26-65%) Major ROM Loss  (66-100%) Symptoms    Flexion        Extension        Sideglide Right        Sideglide Left          Special tests:   Right Left   Slump *** ***   SLR *** ***   Babinski *** ***   SIJ cluster: *** ***   Femoral nerve *** ***       NEUROLOGICAL:  MOTOR STRENGTH  Muscle Right Left   Hip Flexion {sv_MMT:49440::WFL} {sv_MMT:49440::WFL}   Knee extension {sv_MMT:49440::WFL} {sv_MMT:49440::WFL}   Ankle DF {sv_MMT:49440::WFL} {sv_MMT:49440::WFL}   Great toe extension {sv_MMT:49440::WFL} {sv_MMT:49440::WFL}   Ankle PF {sv_MMT:49440::WFL} {sv_MMT:49440::WFL}     SENSATION - LIGHT TOUCH  Dermatome Right Left   L2 {LB_IntactAbsent:43565::Intact} {LB_IntactAbsent:43565::Intact}   L3 {LB_IntactAbsent:43565::Intact} {LB_IntactAbsent:43565::Intact}   L4 {LB_IntactAbsent:43565::Intact} {LB_IntactAbsent:43565::Intact}   L5 {LB_IntactAbsent:43565::Intact} {LB_IntactAbsent:43565::Intact}   S1 {LB_IntactAbsent:43565::Intact} {LB_IntactAbsent:43565::Intact}     MUSCLE STRETCH REFLEXES  Reflex Right Left   Patella {lbmsr:43155::2+} {lbmsr:43155::2+}   achilles {lbmsr:43155::2+} {lbmsr:43155::2+}     intervention Baseline change   ***                          Palpation: TTP: ***  Mobility: ***    Evaluation: ***  Therapeutic exercise: ***  Total treatment: ***                                    I attest that I have reviewed the above information.  Signed: Vertis Kelch, PT  04/27/2021 1:48 PM

## 2021-04-28 MED FILL — HUMIRA PEN CITRATE FREE 40 MG/0.4 ML: SUBCUTANEOUS | 28 days supply | Qty: 2 | Fill #0

## 2021-05-21 NOTE — Unmapped (Signed)
[facility] Refill Coordination Note    Specialty Medication(s) to be Shipped:   Inflammatory Disorders: Humira    Other medication(s) to be shipped: No additional medications requested for fill at this time     Carol Mendoza, DOB: [birth year]  Phone: There are no phone numbers on file.      All above HIPAA information was verified with patient.     Was a Nurse, learning disability used for this call? No    Completed refill call assessment today to schedule patient's medication shipment from the [facility] [phone]).  All relevant notes have been reviewed.     Specialty medication(s) and dose(s) confirmed: Regimen is correct and unchanged.   Changes to medications: Carol Mendoza reports no changes at this time.  Changes to insurance: No  New side effects reported not previously addressed with a pharmacist or physician: None reported  Questions for the pharmacist: No    Confirmed patient received a Conservation officer, historic buildings and a Surveyor, mining with first shipment. The patient will receive a drug information handout for each medication shipped and additional FDA Medication Guides as required.       DISEASE/MEDICATION-SPECIFIC INFORMATION        For patients on injectable medications: Patient currently has 0 doses left.  Next injection is scheduled for 06/02/21.    SPECIALTY MEDICATION ADHERENCE     Medication Adherence    Patient reported X missed doses in the last month: 0  Specialty Medication: Humira  Patient is on additional specialty medications: No              Were doses missed due to medication being on hold? No    Humira 40/0.4 mg/ml: 0 days of medicine on hand        REFERRAL TO PHARMACIST     Referral to the pharmacist: Not needed      [facility]     Shipping address confirmed in Epic.     Delivery Scheduled: Yes, Expected medication delivery date: 05/27/21.     Medication will be delivered via UPS to the prescription address in Epic WAM.    Unk Lightning   [facility] Specialty Technician

## 2021-05-26 MED FILL — HUMIRA PEN CITRATE FREE 40 MG/0.4 ML: SUBCUTANEOUS | 28 days supply | Qty: 2 | Fill #1

## 2021-06-23 NOTE — Unmapped (Signed)
[facility] Refill Coordination Note    Specialty Medication(s) to be Shipped:   Inflammatory Disorders: Humira    Other medication(s) to be shipped: No additional medications requested for fill at this time     Carol Mendoza, DOB: 03-19-1966  Phone: There are no phone numbers on file.      All above HIPAA information was verified with patient.     Was a Nurse, learning disability used for this call? No    Completed refill call assessment today to schedule patient's medication shipment from the [facility] [phone]).  All relevant notes have been reviewed.     Specialty medication(s) and dose(s) confirmed: Regimen is correct and unchanged.   Changes to medications: [place] reports no changes at this time.  Changes to insurance: No  New side effects reported not previously addressed with a pharmacist or physician: None reported  Questions for the pharmacist: No    Confirmed patient received a Conservation officer, historic buildings and a Surveyor, mining with first shipment. The patient will receive a drug information handout for each medication shipped and additional FDA Medication Guides as required.       DISEASE/MEDICATION-SPECIFIC INFORMATION        For patients on injectable medications: Patient currently has 0 doses left.  Next injection is scheduled for 06/30/2021.    SPECIALTY MEDICATION ADHERENCE     Medication Adherence    Patient reported X missed doses in the last month: 0  Specialty Medication: Humira 40mg /0.16ml  Patient is on additional specialty medications: No  Patient is on more than two specialty medications: No  Any gaps in refill history greater than 2 weeks in the last 3 months: no  Demonstrates understanding of importance of adherence: yes  Informant: patient  Reliability of informant: reliable  Confirmed plan for next specialty medication refill: delivery by pharmacy  Refills needed for supportive medications: not needed              Were doses missed due to medication being on hold? No    Humira 40/0.4 mg/ml: 0 days of medicine on hand        REFERRAL TO PHARMACIST     Referral to the pharmacist: Not needed      [facility]     Shipping address confirmed in Epic.     Delivery Scheduled: Yes, Expected medication delivery date: 06/27/2021.     Medication will be delivered via Same Day Courier to the prescription address in Epic WAM.    Anastasya Jewell D Kalandra Masters   [facility] Shared [facility] Specialty Technician

## 2021-06-27 MED FILL — HUMIRA PEN CITRATE FREE 40 MG/0.4 ML: SUBCUTANEOUS | 28 days supply | Qty: 2 | Fill #2

## 2021-07-18 NOTE — Unmapped (Signed)
[facility] Refill Coordination Note    Specialty Medication(s) to be Shipped:   Inflammatory Disorders: Humira    Other medication(s) to be shipped: No additional medications requested for fill at this time     Carol Mendoza, DOB: [birth year]  Phone: There are no phone numbers on file.      All above HIPAA information was verified with patient.     Was a Nurse, learning disability used for this call? No    Completed refill call assessment today to schedule patient's medication shipment from the [facility] [phone]).  All relevant notes have been reviewed.     Specialty medication(s) and dose(s) confirmed: Regimen is correct and unchanged.   Changes to medications: Carol Mendoza reports no changes at this time.  Changes to insurance: No  New side effects reported not previously addressed with a pharmacist or physician: None reported  Questions for the pharmacist: No    Confirmed patient received a Conservation officer, historic buildings and a Surveyor, mining with first shipment. The patient will receive a drug information handout for each medication shipped and additional FDA Medication Guides as required.       DISEASE/MEDICATION-SPECIFIC INFORMATION        For patients on injectable medications: Patient currently has 0 doses left.  Next injection is scheduled for 07/28/2021.    SPECIALTY MEDICATION ADHERENCE     Medication Adherence    Patient reported X missed doses in the last month: 0  Specialty Medication: Humira  Patient is on additional specialty medications: No  Any gaps in refill history greater than 2 weeks in the last 3 months: no  Demonstrates understanding of importance of adherence: yes  Informant: patient  Reliability of informant: reliable  Confirmed plan for next specialty medication refill: delivery by pharmacy  Refills needed for supportive medications: not needed              Were doses missed due to medication being on hold? No    Humira 40/0.4 mg/ml: 0 days of medicine on hand REFERRAL TO PHARMACIST     Referral to the pharmacist: Not needed      [facility]     Shipping address confirmed in Epic.     Delivery Scheduled: Yes, Expected medication delivery date: 07/21/2021.     Medication will be delivered via Same Day Courier to the prescription address in Epic WAM.    Carol Mendoza   [facility] Specialty Technician

## 2021-07-21 MED FILL — HUMIRA PEN CITRATE FREE 40 MG/0.4 ML: SUBCUTANEOUS | 28 days supply | Qty: 2 | Fill #3

## 2021-07-21 NOTE — Unmapped (Unsigned)
Otolaryngology New Consult Visit Note      Reason for Visit:  otalgia    History of Present Illness    Carol Mendoza is 56 y.o. female being seen in consultation at the request of No PCP Per Patient  for evaluation of otalgia.       The patient denies {DL Review of QQVZDGLO:75643}    Past Medical History     has a past medical history of Depression, Hypertension, Pellucid marginal degeneration of both corneas, and Sleep apnea.    Past Surgical History     has no past surgical history on file.    Current Medications    Current Outpatient Medications   Medication Sig Dispense Refill   ??? acetaminophen (TYLENOL) 500 MG tablet Take 500 mg by mouth every four (4) hours as needed for pain.     ??? amLODIPine (NORVASC) 5 MG tablet Take 5 mg by mouth.     ??? betamethasone dipropionate (DIPROLENE) 0.05 % ointment Apply topically two (2) times a day as needed. 45 g 2   ??? betamethasone valerate (VALISONE) 0.1 % cream Apply topically Two (2) times a day.     ??? empty container Misc Use as directed to dispose of Humira pens. 1 each 2   ??? fluconazole (DIFLUCAN) 150 MG tablet TAKE 1 TABLET BY MOUTH 1 TIME A WEEK     ??? HUMIRA PEN CITRATE FREE 40 MG/0.4 ML Inject the contents of 1 pen (40 mg total) under the skin every fourteen (14) days. 2 each 3   ??? HUMIRA PEN CITRATE FREE STARTER PACK FOR PS/UV 1X 80 MG/0.8 ML, 2X 40 MG/0.4 ML Inject the contents of 1 pen (80 mg) on day 1, then 1 pen (40 mg) every other week beginning 1 week after initial dose 3 each 1   ??? ibuprofen (ADVIL,MOTRIN) 800 MG tablet Take 1 tablet (800 mg total) by mouth every eight (8) hours as needed for pain. 30 tablet 0     No current facility-administered medications for this visit.       Allergies    Allergies   Allergen Reactions   ??? Septa Itching     Breathing trouble       Family History    family history includes Breast cancer in her mother; Cancer in her father; Diabetes in her brother, father, and mother; Hypertension in her brother, father, and mother.    Social History:     reports that she has never smoked. She has never used smokeless tobacco.   has no history on file for alcohol use.   has no history on file for drug use.    Review of Systems    A full  review of systems is documented and negative except as noted in HPI.  Patient intake form reviewed.    Vital Signs  There were no vitals taken for this visit.    Physical Exam    General: Well-developed, well-nourished. Appropriate, comfortable, and in no apparent distress.  Head/Face: On external examination there is no obvious asymmetry or scars. On palpation there is no tenderness over maxillary sinuses or masses within the salivary glands. Cranial nerves V and VII are intact through all distributions.  Eyes: PERRL, EOMI, the conjunctiva are not injected and sclera is non-icteric.  Ears:   Right ear: On external exam, there is no obvious lesions or asymmetry. {DL PIRJJOA:41660}. No lesions in the EAC. {DL YT:01601}  Left ear: On external exam, there is no obvious lesions  or asymmetry. {DL ONGEXBM:84132}. No lesions in the EAC. {DL GM:01027}  Hearing is {DL OZDGUYQ:03474}  Nose:  {DLNose:66445}  Oral cavity/oropharynx: The mucosa of the lips, gums, hard and soft palate, posterior pharyngeal wall, tongue, floor of mouth, and buccal region are without masses or lesions and are normally hydrated. {DLOC:66443}{DLOP:66444}Tonsils are {DLtonsil:66405}  IDL:{IDL:66217}  Voice: clear   Neck/Lymphatics: There is no asymmetry or masses. Trachea is midline. There is no enlargement of the thyroid or palpable thyroid nodules. There is no palpable lymphadenopathy along the jugulodiagastric, submental, or posterior cervical chains.  Respiratory: No audible wheeze, unlabored respirations.  Neurologic: Cranial nerve???s II-XII are grossly intact. Exam is non-focal.  Cardiovascular: No cyanosis, clubbing or edema.    Procedure  ***    Audiogram  The audiogram was reviewed. 08/04/21 This demonstrates a ***    Imaging  I personally reviewed the patients imaging studies. ***    Assessment/Recommendations:    The patient is a 56 y.o. female with a history of  has a past medical history of Depression, Hypertension, Pellucid marginal degeneration of both corneas, and Sleep apnea. who presents for the evaluation of ***.        The patient voiced complete understanding of plan as detailed above and is in full agreement.

## 2021-08-06 NOTE — Unmapped (Signed)
Assessment/Plan:   Carol Mendoza is a 56 y.o. 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     Back pain with arthralgias   There are some features that are not consistent with an inflammatory arthritis such as improvement with rest, age of onset >12 (for sacroiliitis). She also notes very limited improvement of back pain or other joint pain (except for some improvement in ankle swelling) while on humira.  I discussed with the patient that overall my suspicion is that this is osteoarthritis with degenerative changes causing her back pain.  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  I discussed my strong recommendation that she work with PT to come up with ways that she could move that would help build paraspinal muscle.  She expressed understanding.   -- will obtain updated hand imaging studies    -- she did note taking 4 pills of ibuprofen every 4 hours and we discussed that this would cause kidney damage. We discussed trial of patches like lidocaine patches, the icy hot. Alternate tylenol (500 mg) with ibuprofen -- do NOT take 4 pills of ibuprofen every 4 hours and use flexeril -- which should not be used right before driving.    -- discussed my strong recommendation of trial of PT     Patient discussed with attending physician, Dr.Orzechowski    Health Care Maintenance:  Routine health maintenance discussed  Immunization History   Administered Date(s) Administered    COVID-19 VACCINE,MRNA(MODERNA)(PF) 01/18/2020, 02/15/2020       Myasia was seen today for follow-up and joint pain.    Diagnoses and all orders for this visit:    Degenerative disc disease, lumbar  -     CBC w/ Differential  -     Comprehensive Metabolic Panel  -     Ambulatory referral to Physical Therapy; Future  -     cyclobenzaprine (FLEXERIL) 5 MG tablet; Take 1 tablet (5 mg total) by mouth Three (3) times a day as needed for muscle spasms.  -     diclofenac sodium (VOLTAREN) 1 % gel; Apply 4 g topically four (4) times a day.    Psoriasis  -     Ambulatory referral to Rheumatology  -     XR Hand 2 Views Bilateral; Future    Fatigue, unspecified type  -     Ambulatory referral to Rheumatology    Joint stiffness  -     Ambulatory referral to Rheumatology  -     XR Hand 2 Views Bilateral; Future    Hearing loss, unspecified hearing loss type, unspecified laterality  -     Ambulatory referral to Rheumatology    Medication monitoring encounter  -     CBC w/ Differential  -     Comprehensive Metabolic Panel        RTC in Return in about 1 year (around 08/09/2022).    I appreciate the opportunity to participate and collaborate in the care of this patient.      History of Present Illness:     The patient was seen in consultation at the request of Dr. Quillian Quince for the evaluation of fatigue and joint pain.     Primary Care Provider: No PCP Per Patient    Chief Complaint: back  pain    HPI:  Carol Mendoza is a 56 y.o.  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     She notes a lot of pain 2-3 years ago that that starts in her low back and around through her hips. She notes pain in her feet, ankles, knees and wrists as well as the fingers. She notes that the back pain bothers her worse and walking is difficulty. She is not able to sleep well. She notes that she was asked to go to PT but has been in so much pain that she cannot do this.     She has tried Aleve and acetaminophen but notes that there has     She notes the pain is worse if she is active. She is very active at work. Sitting still is best. Laying flat in bed helps. Pain worse at work due to bending, mopping.     She notes that she has had pain going down her legs and her hands getting tinging -- left hand although the way down her hand.    She notes generalized weakness.     There is no fevers, chills, loss of bowel or bladder function, saddle anesthesia.      The humira has helped the psoriasis -- she thinks that she started it about end of October. She notes no difference in her joint pains at all since starting the humira.     She notes that she thinks that her pain may have gotten worse when she went off her anti-depressants (Wellbutrin and prozac). She notes feeling anxious. She had gone off of them as she was feeling so exhausted.      She has seen the spine center and they noted some arthritis.      Grandkids -- 80 and 29 years old.     Review of Systems: (positive in bold, remainder are negative)  General: fever, weight gain -- used to walk every day and gained 25-30 lbs, fatigue, sleep issue  HEENT: sensitivity to bright lights, dry eyes, dry mouth,   Integumentary: rash  Lungs/heart:  chest tightness, shortness of breath,   Gastrointestinal: GERD, dysphagia, abdominal pain, nausea, vomiting, diarrhea, hematochezia, melena  Genitourinary: hematuria, dysuria, urinary frequency, kidney stones, genital ulcers  Neurologic: headaches, numbness in left hand, generalized weakness,   MSK: joint pain, swelling -- in the fingers, morning stiffness -- 2 hours at least, myalgias -- some cramps in shins.      Balance of 10 systems was reviewed and is negative except for that mentioned in the HPI.    Review of records: I have reviewed labs/images/clinic notes per the computerized medical record.     XR from 2017: There is marked diffuse juxta-articular osteopenia such as may be seen in the setting of rheumatoid arthritis and there is mild joint space narrowing of the digits. There are no erosions.     Allergies:  Septa    Medications: Current Outpatient Medications:     acetaminophen (TYLENOL) 500 MG tablet, Take 500 mg by mouth every four (4) hours as needed for pain., Disp: , Rfl:     amLODIPine (NORVASC) 5 MG tablet, Take 5 mg by mouth., Disp: ,  Rfl:     betamethasone dipropionate (DIPROLENE) 0.05 % ointment, Apply topically two (2) times a day as needed., Disp: 45 g, Rfl: 2    betamethasone valerate (VALISONE) 0.1 % cream, Apply topically Two (2) times a day., Disp: , Rfl:     cyclobenzaprine (FLEXERIL) 5 MG tablet, Take 1 tablet (5 mg total) by mouth Three (3) times a day as needed for muscle spasms., Disp: 30 tablet, Rfl: 0    diclofenac sodium (VOLTAREN) 1 % gel, Apply 4 g topically four (4) times a day., Disp: 100 g, Rfl: 0    empty container Misc, Use as directed to dispose of Humira pens. (Patient not taking: Reported on 08/08/2021), Disp: 1 each, Rfl: 2    fluconazole (DIFLUCAN) 150 MG tablet, TAKE 1 TABLET BY MOUTH 1 TIME A WEEK (Patient not taking: Reported on 08/08/2021), Disp: , Rfl:     HUMIRA PEN CITRATE FREE 40 MG/0.4 ML, Inject the contents of 1 pen (40 mg total) under the skin every fourteen (14) days., Disp: 2 each, Rfl: 3    HUMIRA PEN CITRATE FREE STARTER PACK FOR PS/UV 1X 80 MG/0.8 ML, 2X 40 MG/0.4 ML, Inject the contents of 1 pen (80 mg) on day 1, then 1 pen (40 mg) every other week beginning 1 week after initial dose (Patient not taking: Reported on 08/08/2021), Disp: 3 each, Rfl: 1    ibuprofen (ADVIL,MOTRIN) 800 MG tablet, Take 1 tablet (800 mg total) by mouth every eight (8) hours as needed for pain., Disp: 30 tablet, Rfl: 0    Medical History:  Past Medical History:   Diagnosis Date    Depression     Hypertension     Pellucid marginal degeneration of both corneas     Sleep apnea        Surgical History:  No past surgical history on file.    Social History:  Social History     Tobacco Use    Smoking status: Never    Smokeless tobacco: Never       Family History:  Family History   Problem Relation Age of Onset Diabetes Mother     Hypertension Mother     Breast cancer Mother     Diabetes Father     Cancer Father     Hypertension Father     Diabetes Brother     Hypertension Brother     Amblyopia Neg Hx     Blindness Neg Hx     Macular degeneration Neg Hx     Retinal detachment Neg Hx     Strabismus Neg Hx     Melanoma Neg Hx     Basal cell carcinoma Neg Hx     Squamous cell carcinoma Neg Hx            Objective   Vitals:    08/08/21 1036   BP: 148/75   BP Site: L Arm   BP Position: Sitting   BP Cuff Size: Large   Pulse: 74   Temp: 36.3 ??C (97.3 ??F)   TempSrc: Temporal   Weight: (!) 108.9 kg (240 lb)   Height: 165.1 cm (5' 5)       Physical Exam:  General: Well-appearing.  HEENT: Adequate tear meniscus. Sclera anicteric;   Pulmonary: Unremarkable respiratory effort. CTAB.  Cardiac: S1 and S2 present, RRR.   Neuro: independent ambulation to exam table. Moving all four extremities.  Strength: deltoid 5/5 symmetric, finger grip 4/5 symmetrically; hip flexion 5/5 symmetrically; plantarflexion  5/5 symmetrically; dorsiflexion 5/5 symmetrically;   Psych: Euthymic affect.  MSK: No overt bony deformities or gross abnormalities in range of motion.   Schober test 12.5 cm -- exam with flexion limited by pain.   Hands: No swelling or tenderness to palpation of the MCPs, PIPs, or DIPs.  Wrists: No swelling or tenderness to palpation of the wrists or deficit in range of motion.  Elbows: No swelling or tenderness to palpation of the elbows.  Shoulders: No swelling or tenderness to palpation of the bilateral shoulders. Good internal and external rotation.  Hips: Tenderness to lateral aspect of the hips. Good internal and external rotation  Knees: No swelling or tenderness to palpation of the bilateral knees.  Ankles: No swelling or tenderness to palpation of the bilateral ankles.  Feet: No swelling or tenderness to palpation of the ankle or MTPs.  Skin: There is evidence of plaque around feet and ankles.   Extremities: Warm, no LE edema.  Point of care [place] of hands with Dr. Berton Lan review demonstrated no evidence of color power doppler signal in PIP or MCP, no erosions or joint effusions seen.

## 2021-08-08 ENCOUNTER — Ambulatory Visit: Admit: 2021-08-08 | Discharge: 2021-08-08 | Payer: PRIVATE HEALTH INSURANCE

## 2021-08-08 DIAGNOSIS — L409 Psoriasis, unspecified: Principal | ICD-10-CM

## 2021-08-08 DIAGNOSIS — R5383 Other fatigue: Principal | ICD-10-CM

## 2021-08-08 DIAGNOSIS — H9190 Unspecified hearing loss, unspecified ear: Principal | ICD-10-CM

## 2021-08-08 DIAGNOSIS — M2560 Stiffness of unspecified joint, not elsewhere classified: Principal | ICD-10-CM

## 2021-08-08 DIAGNOSIS — M5136 Other intervertebral disc degeneration, lumbar region: Principal | ICD-10-CM

## 2021-08-08 DIAGNOSIS — Z5181 Encounter for therapeutic drug level monitoring: Principal | ICD-10-CM

## 2021-08-08 LAB — COMPREHENSIVE METABOLIC PANEL
ALBUMIN: 3.9 g/dL (ref 3.4–5.0)
ALKALINE PHOSPHATASE: 59 U/L (ref 46–116)
ALT (SGPT): 89 U/L — ABNORMAL HIGH (ref 10–49)
ANION GAP: 9 mmol/L (ref 5–14)
AST (SGOT): 67 U/L — ABNORMAL HIGH (ref <=34)
BILIRUBIN TOTAL: 0.6 mg/dL (ref 0.3–1.2)
BLOOD UREA NITROGEN: 12 mg/dL (ref 9–23)
BUN / CREAT RATIO: 16
CALCIUM: 9.4 mg/dL (ref 8.7–10.4)
CHLORIDE: 105 mmol/L (ref 98–107)
CO2: 25.7 mmol/L (ref 20.0–31.0)
CREATININE: 0.76 mg/dL
EGFR CKD-EPI (2021) FEMALE: >90 mL/min/{1.73_m2} (ref >=60)
GLUCOSE RANDOM: 124 mg/dL — ABNORMAL HIGH (ref 70–99)
POTASSIUM: 4.2 mmol/L (ref 3.4–4.8)
PROTEIN TOTAL: 7.2 g/dL (ref 5.7–8.2)
SODIUM: 140 mmol/L (ref 135–145)

## 2021-08-08 LAB — CBC W/ AUTO DIFF
BASOPHILS ABSOLUTE COUNT: 0.1 10*9/L (ref 0.0–0.1)
BASOPHILS RELATIVE PERCENT: 1 %
EOSINOPHILS ABSOLUTE COUNT: 0.5 10*9/L (ref 0.0–0.5)
EOSINOPHILS RELATIVE PERCENT: 6.4 %
HEMATOCRIT: 41.6 % (ref 34.0–44.0)
HEMOGLOBIN: 13.9 g/dL (ref 11.3–14.9)
LYMPHOCYTES ABSOLUTE COUNT: 2.6 10*9/L (ref 1.1–3.6)
LYMPHOCYTES RELATIVE PERCENT: 30 %
MEAN CORPUSCULAR HEMOGLOBIN CONC: 33.4 g/dL (ref 32.0–36.0)
MEAN CORPUSCULAR HEMOGLOBIN: 30.9 pg (ref 25.9–32.4)
MEAN CORPUSCULAR VOLUME: 92.5 fL (ref 77.6–95.7)
MEAN PLATELET VOLUME: 8.3 fL (ref 6.8–10.7)
MONOCYTES ABSOLUTE COUNT: 0.7 10*9/L (ref 0.3–0.8)
MONOCYTES RELATIVE PERCENT: 7.8 %
NEUTROPHILS ABSOLUTE COUNT: 4.7 10*9/L (ref 1.8–7.8)
NEUTROPHILS RELATIVE PERCENT: 54.8 %
NUCLEATED RED BLOOD CELLS: 0 /100{WBCs} (ref <=4)
PLATELET COUNT: 293 10*9/L (ref 150–450)
RED BLOOD CELL COUNT: 4.49 10*12/L (ref 3.95–5.13)
RED CELL DISTRIBUTION WIDTH: 12.6 % (ref 12.2–15.2)
WBC ADJUSTED: 8.6 10*9/L (ref 3.6–11.2)

## 2021-08-08 MED ORDER — CYCLOBENZAPRINE 5 MG TABLET
ORAL_TABLET | Freq: Three times a day (TID) | ORAL | 0 refills | 10 days | Status: CP | PRN
Start: 2021-08-08 — End: ?

## 2021-08-08 MED ORDER — DICLOFENAC 1 % TOPICAL GEL
Freq: Four times a day (QID) | TOPICAL | 0 refills | 7 days | Status: CP
Start: 2021-08-08 — End: 2022-08-08

## 2021-08-08 NOTE — Unmapped (Signed)
Dear Carol Mendoza    It was nice to see you!     Today in clinic we discussed the following:     For pain relief:  Long term -- we need you to see spine center again and to do PT to train the muscles in how to work.   For pain  Try patches like lidocaine patches, the icy hot  Alternate tylenol (500 mg) with ibuprofen -- do NOT take 4 pills of ibuprofen every 4 hours -- that's too much and hurts your kidneys. Use flexeril as well.    We are going to check blood work and x-rays.     Please know you can reach out to me if you have any issues getting a medication (availability or cost) that I prescribe.    MyChart messages may take up to 48-72 hours for response and should not be used in an emergency.     Have a nice day!     Johnanna Schneiders MD   [facility]  [address]  [place], [place] [address]  Phone: [phone]  Fax: [phone]

## 2021-08-12 DIAGNOSIS — L409 Psoriasis, unspecified: Principal | ICD-10-CM

## 2021-08-12 MED ORDER — HUMIRA PEN CITRATE FREE 40 MG/0.4 ML
SUBCUTANEOUS | 3 refills | 28 days
Start: 2021-08-12 — End: ?

## 2021-08-12 NOTE — Unmapped (Signed)
[facility] Refill Coordination Note    Specialty Medication(s) to be Shipped:   Inflammatory Disorders: Humira    Other medication(s) to be shipped: No additional medications requested for fill at this time     Carol Mendoza, DOB: [birth year]  Phone: There are no phone numbers on file.      All above HIPAA information was verified with patient.     Was a Nurse, learning disability used for this call? No    Completed refill call assessment today to schedule patient's medication shipment from the [facility] [phone]).  All relevant notes have been reviewed.     Specialty medication(s) and dose(s) confirmed: Regimen is correct and unchanged.   Changes to medications: Carol Mendoza reports starting the following medications: prozac & wellbutrin   Changes to insurance: No  New side effects reported not previously addressed with a pharmacist or physician: None reported  Questions for the pharmacist: No    Confirmed patient received a Conservation officer, historic buildings and a Surveyor, mining with first shipment. The patient will receive a drug information handout for each medication shipped and additional FDA Medication Guides as required.       DISEASE/MEDICATION-SPECIFIC INFORMATION        For patients on injectable medications: Patient currently has 0 doses left.  Next injection is scheduled for 04/13.    SPECIALTY MEDICATION ADHERENCE     Medication Adherence    Patient reported X missed doses in the last month: 0  Specialty Medication: humira 40mg /0.52ml  Patient is on additional specialty medications: No  Patient is on more than two specialty medications: No  Any gaps in refill history greater than 2 weeks in the last 3 months: no  Demonstrates understanding of importance of adherence: yes  Informant: patient  Reliability of informant: reliable  Patient is at risk for Non-Adherence: No  Reasons for non-adherence: no problems identified  Confirmed plan for next specialty medication refill: delivery by pharmacy  Refills needed for supportive medications: yes, ordered or provider notified          Refill Coordination    Has the Patients' Contact Information Changed: No  Is the Shipping Address Different: No         Were doses missed due to medication being on hold? No    humira 40/0.4 mg/ml: 0 days of medicine on hand         REFERRAL TO PHARMACIST     Referral to the pharmacist: Not needed      [facility]     Shipping address confirmed in Epic.     Delivery Scheduled: Yes, Expected medication delivery date: 04/06.  However, Rx request for refills was sent to the provider as there are none remaining.     Medication will be delivered via Same Day Courier to the prescription address in Epic WAM.    Antonietta Barcelona   [facility] Specialty Technician

## 2021-08-15 MED ORDER — HUMIRA PEN CITRATE FREE 40 MG/0.4 ML
SUBCUTANEOUS | 3 refills | 28.00000 days | Status: CP
Start: 2021-08-15 — End: ?
  Filled 2021-08-18: qty 2, 28d supply, fill #0

## 2021-08-20 ENCOUNTER — Emergency Department
Admission: EM | Admit: 2021-08-20 | Discharge: 2021-08-20 | Disposition: A | Discharge status: Home / Self Care | Payer: BLUE CROSS/BLUE SHIELD | Attending: Emergency Medicine | Admitting: Emergency Medicine

## 2021-08-20 ENCOUNTER — Other Ambulatory Visit: Payer: Self-pay

## 2021-08-20 DIAGNOSIS — H5712 Ocular pain, left eye: Secondary | ICD-10-CM | POA: Diagnosis present

## 2021-08-20 DIAGNOSIS — H16002 Unspecified corneal ulcer, left eye: Secondary | ICD-10-CM | POA: Diagnosis not present

## 2021-08-20 MED ORDER — OXYCODONE-ACETAMINOPHEN 5-325 MG PO TABS: 1.0000 | ORAL_TABLET | ORAL | 0 refills | Status: DC | PRN

## 2021-08-20 MED ORDER — TETRACAINE HCL 0.5 % OP SOLN
2.0000 [drp] | Freq: Once | OPHTHALMIC | Status: AC
  Administered 2021-08-20: 2 [drp] via OPHTHALMIC
  Filled 2021-08-20: qty 4

## 2021-08-20 MED ORDER — OXYCODONE-ACETAMINOPHEN 5-325 MG PO TABS
1.0000 | ORAL_TABLET | Freq: Once | ORAL | Status: AC
  Administered 2021-08-20: 1 via ORAL
  Filled 2021-08-20: qty 1

## 2021-08-20 MED ORDER — FLUORESCEIN SODIUM 1 MG OP STRP
1.0000 | ORAL_STRIP | Freq: Once | OPHTHALMIC | Status: AC
  Administered 2021-08-20: 1 via OPHTHALMIC
  Filled 2021-08-20: qty 1

## 2021-08-20 MED ORDER — MOXIFLOXACIN HCL 0.5 % OP SOLN
1.0000 [drp] | Freq: Once | OPHTHALMIC | Status: AC
  Administered 2021-08-20: 1 [drp] via OPHTHALMIC
  Filled 2021-08-20: qty 3

## 2021-08-20 NOTE — ED Notes (Signed)
While going over discharge instructions with pt, she states that she"cannot breathe" and that she's having an "anxiety attack". Reassured pt that her O2 sats are 98-99% and discussed techniques for relieving anxiety such as deep breathing exercises, distraction, etc. Pt's visitor states, "I don't think she needs to go home yet". MD notified and aware.  ?

## 2021-08-20 NOTE — ED Notes (Signed)
Pharmacy called for Vigamox, will verify and send through the tube station ?

## 2021-08-20 NOTE — ED Notes (Addendum)
MD at the bedside  

## 2021-08-20 NOTE — ED Provider Notes (Signed)
? ?[facility]Provider Note ? ? ? Event Date/Time  ? First MD Initiated Contact with Patient 08/20/21 [phone]   ?  (approximate) ? ? ?History  ? ?Chief Complaint ?Eye Pain, Nausea, and Shortness of Breath ? ? ?HPI ? ?AREMI DEATON is a 56 y.o. female with no significant past medical history presents to the ED complaining of eye pain.  Patient reports that she has had 24 to 48 hours of gradually worsening pain in her left eye.  She now describes the pain as severe and boring, feeling like there is pressure behind the left eye.  She has had significant clear tearing from the eye along with redness of her conjunctive a.  She feels like the vision in her left eye is blurry as a result of the symptoms and she has been dealing with significant photophobia.  She denies any fevers or swelling around the eye.  She does state that she typically wears hard contacts and has been wearing these for longer periods of time due to visiting a family member in the hospital.  She is not aware of any trauma to the eye. ?  ? ? ?Physical Exam  ? ?Triage Vital Signs: ?ED Triage Vitals [08/20/21 0440]  ?Enc Vitals Group  ?   BP (!) 151/67  ?   Pulse Rate (!) 111  ?   Resp 18  ?   Temp 98 ?F (36.7 ?C)  ?   Temp Source Oral  ?   SpO2 95 %  ?   Weight   ?   Height   ?   Head Circumference   ?   Peak Flow   ?   Pain Score   ?   Pain Loc   ?   Pain Edu?   ?   Excl. in [place]?   ? ? ?Most recent vital signs: ?Vitals:  ? 08/20/21 0440  ?BP: (!) 151/67  ?Pulse: (!) 111  ?Resp: 18  ?Temp: 98 ?F (36.7 ?C)  ?SpO2: 95%  ? ? ?Constitutional: Alert and oriented. ?Eyes: Conjunctiva with significant erythema on left.  Pupils equal, round, and reactive to light bilaterally with associated photophobia.  Small area of corneal ulceration with associated cloudiness on the left, associated fluorescein uptake noted.  No hypopyon noted. ?Head: Atraumatic. ?Nose: No congestion/rhinnorhea. ?Mouth/Throat: Mucous membranes are moist.   ?Cardiovascular: Normal rate, regular rhythm. Grossly normal heart sounds.  2+ radial pulses bilaterally. ?Respiratory: Normal respiratory effort.  No retractions. Lungs CTAB. ?Gastrointestinal: Soft and nontender. No distention. ?Musculoskeletal: No lower extremity tenderness nor edema.  ?Neurologic:  Normal speech and language. No gross focal neurologic deficits are appreciated. ? ? ? ?ED Results / Procedures / Treatments  ? ?Labs ?(all labs ordered are listed, but only abnormal results are displayed) ?Labs Reviewed - No data to display ? ? ?PROCEDURES: ? ?Critical Care performed: No ? ?Procedures ? ? ?MEDICATIONS ORDERED IN ED: ?Medications  ?oxyCODONE-acetaminophen (PERCOCET/ROXICET) 5-325 MG per tablet 1 tablet (has no administration in time range)  ?moxifloxacin (VIGAMOX) 0.5 % ophthalmic solution 1 drop (has no administration in time range)  ?tetracaine (PONTOCAINE) 0.5 % ophthalmic solution 2 drop (2 drops Left Eye Given 08/20/21 0519)  ?fluorescein ophthalmic strip 1 strip (1 strip Left Eye Given 08/20/21 0511)  ? ? ? ?IMPRESSION / MDM / ASSESSMENT AND PLAN / ED COURSE  ?I reviewed the triage vital signs and the nursing notes. ?             ?               ? ?  56 y.o. female with no significant past medical history presents to the ED with 24 to 48 hours of gradually worsening pain in her left eye with associated clear drainage, photophobia, and blurry vision. ? ?Differential diagnosis includes, but is not limited to, corneal abrasion, corneal foreign body, corneal ulcer, glaucoma, iritis. ? ?Patient nontoxic-appearing and in no acute distress, vital signs are unremarkable.  There appears to be developing ulceration in her left eye, likely due to prolonged hard contact use.  Visual acuity appears intact per her baseline without contacts, measured at 20/70 on the left and 20/100 on right.  Eye pressure noted to be 16 on the left, no evidence of glaucoma.  Case discussed with Dr. Edison Pace of ophthalmology, who  recommends proceeding with Vigamox drops once every hour for initial treatment.  We will also give dose of oral pain medication.  Dr. Edison Pace will be available for follow-up tomorrow or Monday, states he will be in touch with the patient to check in on her symptoms.  Patient was counseled to return to the ED for new or worsening symptoms, patient agrees with plan. ? ?  ? ? ?FINAL CLINICAL IMPRESSION(S) / ED DIAGNOSES  ? ?Final diagnoses:  ?Corneal ulcer of left eye  ? ? ? ?Rx / DC Orders  ? ?ED Discharge Orders   ? ?      Ordered  ?  oxyCODONE-acetaminophen (PERCOCET) 5-325 MG tablet  Every 4 hours PRN       ? 08/20/21 0539  ? ?  ?  ? ?  ? ? ? ?Note:  This document was prepared using Dragon voice recognition software and may include unintentional dictation errors. ?  ?Blake Divine, MD ?08/20/21 [phone] ? ?

## 2021-08-20 NOTE — ED Triage Notes (Signed)
Presents to ED with (L)eye pain, swelling, itching. Redness to bilateral eyes. Reports clear drainage.  ? ?SOB-onset "a few hours"-Denies recent sick contacts, cough, fever. No distress noted at triage. ? ?Reports onset of nausea yesterday @ 1600. Denies vomiting, diarrhea.  ? ?Relates recently "awake for a long time" d/t mother's hospitalization, surgery here.  ?

## 2021-08-27 ENCOUNTER — Ambulatory Visit: Admit: 2021-08-27 | Discharge: 2021-08-28 | Payer: PRIVATE HEALTH INSURANCE

## 2021-08-27 DIAGNOSIS — R1012 Left upper quadrant pain: Principal | ICD-10-CM

## 2021-08-27 MED ORDER — OMEPRAZOLE 40 MG CAPSULE,DELAYED RELEASE
ORAL_CAPSULE | Freq: Every day | ORAL | 0 refills | 14 days | Status: CP
Start: 2021-08-27 — End: 2021-09-10

## 2021-08-27 MED ADMIN — aluminum-magnesium hydroxide-simethicone (MAALOX PLUS) 200-200-20 mg/5 mL suspension 30 mL: 30 mL | ORAL | @ 17:00:00 | Stop: 2021-08-27

## 2021-08-27 MED ADMIN — lidocaine (XYLOCAINE) 2% viscous mucosal solution: 15 mL | OROMUCOSAL | @ 17:00:00 | Stop: 2021-08-27

## 2021-08-27 NOTE — Unmapped (Signed)
Name:  Carol Mendoza  DOB: Jun 06, 1965  Date: 08/27/2021    ASSESSMENT/PLAN:  Carol Mendoza was seen today for side pain.    Diagnoses and all orders for this visit:    Left upper quadrant abdominal pain  -     POCT urinalysis dipstick  -     XR Abdomen 2 Views and Chest 1 View  -     aluminum-magnesium hydroxide-simethicone (MAALOX PLUS) 200-200-20 mg/5 mL suspension 30 mL  -     lidocaine (XYLOCAINE) 2% viscous mucosal solution      Carol Mendoza is a 56 y.o. female with 1 week history of left upper abdominal pain that radiates around into her back.  Worse with eating.  On exam she is tender in the left upper quadrant as well as left mid back.  UA shows a small amount of bilirubin and urobilinogen.  Her recent CMP with her primary care revealed mildly elevated liver enzymes.  Patient is due for a abdominal CT soon.  X-ray of abdomen and chest were clear.  Patient did report a moderate improvement in pain after GI cocktail.  Will prescribe omeprazole daily for 2 weeks for possible GERD.  Low concern for acute abdomen at this time.  Recommend following up with PCP or GI next week.  Go to ER for any new or worsening symptoms  ------------------------------------------------------------------------------    Chief Complaint   Patient presents with    side pain     Started one week ago with left side rib cage pain that goes around to her back when eating or drinking       HPI: Carol Mendoza is a 56 y.o. female with 1 week history of left upper abdominal pain that radiates into back. States she feels discomfort constantly and becomes worse with anything she eats or drinks. Denies constipation, diarrhea. Developed nausea and chest pain this morning but that has improved. Seems to be better when sitting up instead of lying down. No blood in stool. Denies black stools. No history of GERD. States she has also been experiencing food getting stuck in her esophagus. Had an appointment with GI but had to cancel due to caring for her mother. Denies pain with deep breaths, trouble breathing    ROS:  Review of systems as above.  Rest of review of systems negative unless otherwise noted as per HPI.    I have reviewed past medical, surgical, medications, allergies, social and family histories today and updated them in Epic where appropriate.    PMH:  Past Medical History:   Diagnosis Date    Depression     Hypertension     Pellucid marginal degeneration of both corneas     Sleep apnea          MEDS:    Current Outpatient Medications:     albuterol HFA 90 mcg/actuation inhaler, Inhale 2 puffs every six (6) hours as needed., Disp: , Rfl:     amLODIPine (NORVASC) 5 MG tablet, Take 1 tablet (5 mg total) by mouth., Disp: , Rfl:     betamethasone valerate (VALISONE) 0.1 % cream, Apply topically Two (2) times a day., Disp: , Rfl:     buPROPion (WELLBUTRIN XL) 150 MG 24 hr tablet, Take 1 tablet (150 mg total) by mouth daily., Disp: , Rfl:     FLUoxetine (PROZAC) 20 MG capsule, TAKE 1 CAPSULE BY MOUTH EVERY DAY IN THE MORNING, Disp: , Rfl:     HUMIRA PEN CITRATE FREE 40 MG/0.4 ML,  Inject the contents of 1 pen (40 mg total) under the skin every fourteen (14) days., Disp: 2 each, Rfl: 3    ibuprofen (ADVIL,MOTRIN) 800 MG tablet, Take 1 tablet (800 mg total) by mouth every eight (8) hours as needed for pain., Disp: 30 tablet, Rfl: 0    acetaminophen (TYLENOL) 500 MG tablet, Take 500 mg by mouth every four (4) hours as needed for pain. (Patient not taking: Reported on 08/27/2021), Disp: , Rfl:     betamethasone dipropionate (DIPROLENE) 0.05 % ointment, Apply topically two (2) times a day as needed. (Patient not taking: Reported on 08/27/2021), Disp: 45 g, Rfl: 2    cyclobenzaprine (FLEXERIL) 5 MG tablet, Take 1 tablet (5 mg total) by mouth Three (3) times a day as needed for muscle spasms. (Patient not taking: Reported on 08/27/2021), Disp: 30 tablet, Rfl: 0    diclofenac sodium (VOLTAREN) 1 % gel, Apply 4 g topically four (4) times a day. (Patient not taking: Reported on 08/27/2021), Disp: 100 g, Rfl: 0  No current facility-administered medications for this visit.    ALL:  Allergies   Allergen Reactions    Septa Itching     Breathing trouble       SH:  Social History     Tobacco Use    Smoking status: Never     Passive exposure: Past    Smokeless tobacco: Never   Vaping Use    Vaping Use: Never used         VITALS:  Vitals:    08/27/21 1231   BP: 140/79   Pulse: 72   Resp: 16   Temp: 36.7 ??C (98.1 ??F)   SpO2: 97%     Body mass index is 39.94 kg/m??.    Physical Exam  Constitutional:       Appearance: Normal appearance.   Cardiovascular:      Rate and Rhythm: Normal rate and regular rhythm.      Heart sounds: Normal heart sounds.   Pulmonary:      Effort: Pulmonary effort is normal.      Breath sounds: Normal breath sounds.   Abdominal:      General: Bowel sounds are normal. There is no distension.      Palpations: Abdomen is soft.      Tenderness: There is abdominal tenderness in the left upper quadrant. There is left CVA tenderness. There is no right CVA tenderness.   Neurological:      Mental Status: She is alert.       TEST  RESULTS:    Results for orders placed or performed in visit on 08/27/21   POCT urinalysis dipstick   Result Value Ref Range    Color, UA Yellow     Clarity, UA Clear     Glucose, UA Negative Negative    Bilirubin, UA Small Negative    Ketones, POC Trace Negative    Spec Grav, UA 1.025 1.005 - 1.030    Blood, UA Negative Negative    pH, UA 6.0 5.0 - 9.0    Protein, UA 100 mg/dL Negative    Urobilinogen, UA 1.0 E.U./dL Negative (0.2 mg/dL)    Leukocytes, UA Negative Negative    Nitrite, UA Negative Negative    STRIP LOT NUMBER [id number]     STRIP LOT EXPIRATION 07/13/22      Orders Placed This Encounter   Procedures    XR Abdomen 2 Views and Chest 1 View     Order Specific  Question:   REASON FOR EXAM     Answer:   ABDOMINAL PAIN - OTHER SPECIFIED SITE     Order Specific Question:   Is the patient pregnant?     Answer:   No     Order Specific Question: Performed at     Answer:   In Clinic     Order Specific Question:   Release to patient     Answer:   Immediate    POCT urinalysis dipstick     Order Specific Question:   Release to patient     Answer:   Immediate     Nonobstructive bowel gas pattern.       Clear lungs.       SCREENINGS:    DEPRESSION:    PHQ-2 Score: 0    Screening complete, no depression identified / no further action needed today      Damien Fusi, PA discussed with Maximiano Coss importance of having a PCP for their ongoing care. Patient was given information for Find-A-Doc to choose a PCP.              Durene Cal [place], [place]  [facility] [place]  ----------------------------------------------------------------  Note - This record has been created using [employer]. Chart creation errors have been sought, but may not always have been located. Such creation errors do not reflect on the standard of medical care.

## 2021-08-27 NOTE — Unmapped (Addendum)
Start Omeprazole once daily for 14 days  Avoid fatty foods, spicy foods and acidic foods  Follow up with PCP or GI if symptoms persist, go to ER for new or worsening symptoms.

## 2021-09-12 ENCOUNTER — Ambulatory Visit: Admit: 2021-09-12 | Discharge: 2021-09-13 | Payer: PRIVATE HEALTH INSURANCE

## 2021-09-13 NOTE — Unmapped (Signed)
[facility] Refill Coordination Note    Specialty Medication(s) to be Shipped:   Inflammatory Disorders: Humira    Other medication(s) to be shipped: No additional medications requested for fill at this time     Carlye Panameno, DOB: 01-25-1966  Phone: There are no phone numbers on file.      All above HIPAA information was verified with patient.     Was a Nurse, learning disability used for this call? No    Completed refill call assessment today to schedule patient's medication shipment from the [facility] [phone]).  All relevant notes have been reviewed.     Specialty medication(s) and dose(s) confirmed: Regimen is correct and unchanged.   Changes to medications: Darely reports starting the following medications: prozac & wellbutrin   Changes to insurance: No  New side effects reported not previously addressed with a pharmacist or physician: None reported  Questions for the pharmacist: No    Confirmed patient received a Conservation officer, historic buildings and a Surveyor, mining with first shipment. The patient will receive a drug information handout for each medication shipped and additional FDA Medication Guides as required.       DISEASE/MEDICATION-SPECIFIC INFORMATION        For patients on injectable medications: Patient currently has 0 doses left.  Next injection is scheduled for 09/22/2021.    SPECIALTY MEDICATION ADHERENCE     Medication Adherence    Patient reported X missed doses in the last month: 0  Specialty Medication: Humira  Patient is on additional specialty medications: No  Any gaps in refill history greater than 2 weeks in the last 3 months: no  Demonstrates understanding of importance of adherence: yes  Informant: patient  Reliability of informant: reliable  Confirmed plan for next specialty medication refill: delivery by pharmacy  Refills needed for supportive medications: not needed                Were doses missed due to medication being on hold? No    humira 40/0.4 mg/ml: 0 days of medicine on hand         REFERRAL TO PHARMACIST     Referral to the pharmacist: Not needed      [facility]     Shipping address confirmed in Epic.     Delivery Scheduled: Yes, Expected medication delivery date: 09/15/2021.     Medication will be delivered via Same Day Courier to the prescription address in Epic WAM.    Wei Poplaski D Mendel Binsfeld   [facility] Specialty Technician

## 2021-09-15 MED FILL — HUMIRA PEN CITRATE FREE 40 MG/0.4 ML: SUBCUTANEOUS | 28 days supply | Qty: 2 | Fill #1

## 2021-10-13 NOTE — Unmapped (Signed)
[facility]  NEW Visit  Referring Provider: Dortha Kern   Primary Care Provider: Dortha Kern, MD    Assessment & Plan: Carol Mendoza is a 56 y.o. female with psoriasis (adalimumab), depression, HTN, OSA that presents for abdominal pain.       Dx:  -  Tx:  -work with physical therapist    #Elevated liver enzymes, hepatic steatosis on imaging  Most likely due to NALFD in the setting of likely metabolic syndrome. Hepatitis studies normal.   -encouraged increasing physical exercise and eating a healthy diet    - Colorectal cancer screening: colonoscopy due ***  - Return to clinic in *** months    I personally spent *** minutes face-to-face and non-face-to-face in the care of this patient, which includes all pre, intra, and post visit time on the date of service. Pt seen*** and discussed with Dr. Marland Kitchen who is in agreement with above assessment and plan.     Bill Salinas, MD  [facility] Fellow       Subjective   HPI: Carol Mendoza is a 56 y.o. female with PMH as above that is seen in consultation at the request of Dortha Kern for abdominal pain.     Social History / Context:   -***  -raised 3 kids    Summary of referral, prior notes, prior evaluation:  -***  -09/2011 GI Dr. Pamala Hurry: chronic intermittent RUQ pain in s/o stress, c/f costochondritis and IBS-D -- bentyl - LTFU   -07/2021 CBC wnl, CMP w/ mildly elevated AST/ALT  -07/2021 Rheum: chronic diffuse joint pain - worse off wellbutrin and prozac - c/f OA -- ibuprofen, PT  -08/2021 [facility]: LUQ pain ?rib cage, better w/ GI cocktail -- PPI    Initial HPI 10/2021:     Dysphagia***    No tobacco, ETOH, drug use.*** No NSAID use.*** No prior GI-related surgery.***No known family hx of GI-related cancer***    Current GI medications:  -***    Failed/Prior GI medications:  -bupropion (fatigue)  -fluoxetine (fatigue)    Relevant Medications:   -***    GI Procedures: Reviewed. Pertinent findings include:  ***    Radiographic studies: Reviewed. Pertinent findings include:  09/2021 RUQUS  -No gallstones or biliary dilatation.    -Increased echogenicity of the liver suggestive of hepatic steatosis.     01/2016 RUQUS   IMPRESSION:   Hepatic steatosis    The patient's allergies, past medical, surgical, family, and social histories were reviewed with pertinent information documented.  Past Medical History:  Past Medical History:   Diagnosis Date   ??? Depression    ??? Hypertension    ??? Pellucid marginal degeneration of both corneas    ??? Sleep apnea      Surgical History:   No past surgical history on file.  Family Medical History:  Family History   Problem Relation Age of Onset   ??? Breast cancer Mother    ??? Diabetes Father    ??? Hypertension Father    ??? Diabetes Brother    ??? Hypertension Brother    ??? Amblyopia Neg Hx    ??? Blindness Neg Hx    ??? Macular degeneration Neg Hx    ??? Retinal detachment Neg Hx    ??? Strabismus Neg Hx    ??? Melanoma Neg Hx    ??? Basal cell carcinoma Neg Hx    ??? Squamous cell carcinoma Neg Hx      Social History:  Social History     Socioeconomic History   ??? Marital status: Divorced   ??? Number of children: 3   Tobacco Use   ??? Smoking status: Never     Passive exposure: Past   ??? Smokeless tobacco: Never   Vaping Use   ??? Vaping Use: Never used   Substance and Sexual Activity   ??? Alcohol use: Never   ??? Drug use: Never   ??? Sexual activity: Yes     Partners: Male   Other Topics Concern   ??? Do you use sunscreen? Yes   ??? Tanning bed use? Yes   ??? Are you easily burned? No   ??? Excessive sun exposure? No   ??? Blistering sunburns? No     Allergies:   Allergies   Allergen Reactions   ??? Septa Itching     Breathing trouble     Problem List:  Patient Active Problem List   Diagnosis   ??? Sleep apnea     Medications: Reviewed in EPIC       Objective   Vital Signs:***  There were no vitals filed for this visit.   There is no height or weight on file to calculate BMI.  Wt Readings from Last 6 Encounters:   08/27/21 (!) 108.9 kg (240 lb)   08/08/21 (!) 108.9 kg (240 lb)   01/13/21 (!) 103.9 kg (229 lb)   09/27/11 71.7 kg (158 lb 1.1 oz)   08/23/11 73.5 kg (162 lb)      Physical Exam:   Gen: Well appearing, NAD  CV: RRR  Pulm: Breathing comfortably on RA  Abd: Soft, NTND, normal BS***  Ext: No edema in the bilateral lower extremities   Psychh: Alert and oriented  Perianal/Rectal Exam: no perianal lesions, normal sphincter tone, no masses or tenderness, no stool in rectal vault, no gross blood/melena***    Labs and other studies personally reviewed. Relation Age of Onset   ??? Breast cancer Mother    ??? Diabetes Father    ??? Hypertension Father    ??? Diabetes Brother    ??? Hypertension Brother    ??? Amblyopia Neg Hx    ??? Blindness Neg Hx    ??? Macular degeneration Neg Hx    ??? Retinal detachment Neg Hx    ??? Strabismus Neg Hx    ??? Melanoma Neg Hx    ??? Basal cell carcinoma Neg Hx    ??? Squamous cell carcinoma Neg Hx      Social History:  Social History     Socioeconomic History   ??? Marital status: Divorced   ??? Number of children: 3   Tobacco Use   ??? Smoking status: Never     Passive exposure: Past   ??? Smokeless tobacco: Never   Vaping Use   ??? Vaping Use: Never used   Substance and Sexual Activity   ??? Alcohol use: Never   ??? Drug use: Never   ??? Sexual activity: Yes     Partners: Male   Other Topics Concern   ??? Do you use sunscreen? Yes   ??? Tanning bed use? Yes   ??? Are you easily burned? No   ??? Excessive sun exposure? No   ??? Blistering sunburns? No     Allergies:   Allergies   Allergen Reactions   ??? Septa Itching     Breathing trouble     Problem List:  Patient Active Problem List   Diagnosis   ??? Sleep  apnea     Medications: Reviewed in EPIC       Objective   Vital Signs:  Vitals:    10/14/21 0953   BP: 144/65   BP Site: L Arm   BP Position: Sitting   BP Cuff Size: Medium   Pulse: 73   Weight: (!) 100.9 kg (222 lb 6.4 oz)   Height: 165.1 cm (5' 5)      Body mass index is 37.01 kg/m??.  Wt Readings from Last 6 Encounters:   10/14/21 (!) 100.9 kg (222 lb 6.4 oz)   08/27/21 (!) 108.9 kg (240 lb)   08/08/21 (!) 108.9 kg (240 lb)   01/13/21 (!) 103.9 kg (229 lb)   09/27/11 71.7 kg (158 lb 1.1 oz)   08/23/11 73.5 kg (162 lb)      Physical Exam:   Gen: Well appearing, NAD  CV: RRR  Pulm: Breathing comfortably on RA  Abd: Soft, NTND, normal BS  Ext: No edema in the bilateral lower extremities  MSK: reproducible tenderness on palpation of L chest wall   Psych: Alert and oriented    Labs and other studies personally reviewed.

## 2021-10-13 NOTE — Unmapped (Signed)
Pre called and triaged.

## 2021-10-13 NOTE — Unmapped (Signed)
[facility] Refill Coordination Note    Specialty Medication(s) to be Shipped:   Inflammatory Disorders: Humira    Other medication(s) to be shipped: No additional medications requested for fill at this time     Carol Mendoza, DOB: 10-Oct-1965  Phone: There are no phone numbers on file.      All above HIPAA information was verified with patient.     Was a Nurse, learning disability used for this call? No    Completed refill call assessment today to schedule patient's medication shipment from the [facility] [phone]).  All relevant notes have been reviewed.     Specialty medication(s) and dose(s) confirmed: Regimen is correct and unchanged.   Changes to medications: Marvin reports no changes at this time.  Changes to insurance: No  New side effects reported not previously addressed with a pharmacist or physician: None reported  Questions for the pharmacist: No    Confirmed patient received a Conservation officer, historic buildings and a Surveyor, mining with first shipment. The patient will receive a drug information handout for each medication shipped and additional FDA Medication Guides as required.       DISEASE/MEDICATION-SPECIFIC INFORMATION        For patients on injectable medications: Patient currently has 0 doses left.  Next injection is scheduled for 10/20/2021.    SPECIALTY MEDICATION ADHERENCE     Medication Adherence    Patient reported X missed doses in the last month: 0  Specialty Medication: humira 40/0.4 mg/ml  Patient is on additional specialty medications: No  Informant: patient                Were doses missed due to medication being on hold? No    humira 40/0.4 mg/ml: 0 days of medicine on hand         REFERRAL TO PHARMACIST     Referral to the pharmacist: Not needed      [facility]     Shipping address confirmed in Epic.     Delivery Scheduled: Yes, Expected medication delivery date: 10/18/2021.     Medication will be delivered via Same Day Courier to the prescription address in Epic [place].    Wyatt Mage M Elisabeth Cara   [facility] Specialty Technician

## 2021-10-14 ENCOUNTER — Ambulatory Visit: Admit: 2021-10-14 | Discharge: 2021-10-15 | Payer: PRIVATE HEALTH INSURANCE

## 2021-10-14 MED ORDER — OMEPRAZOLE 40 MG CAPSULE,DELAYED RELEASE
ORAL_CAPSULE | Freq: Two times a day (BID) | ORAL | 1 refills | 180 days | Status: CP
Start: 2021-10-14 — End: 2022-10-09

## 2021-10-14 NOTE — Unmapped (Addendum)
Next steps:  - Call number below to schedule your upper endoscopy and colonoscopy  -START taking omeprazole (proton pump inhibitor) 40mg  twice daily 30-60 minutes prior to meals  -STOP omeprazole 3 weeks prior to endoscopy procedure  - Return to clinic in 3 months after endoscopy procedures (call number below to schedule)  --minimize aleve as able    Lifestyle  -Try eating more frequent smaller meals  -Avoid foods and drinks that trigger reflux symptoms (spicy and acidic foods, alcohol, caffeine)  -Consider raising head of the bed with concrete blocks and avoid sleeping right side down  -Avoid eating within 3 hours of going to sleep   -Continue efforts to lose weight    Thank you for seeing [place] in clinic! If you do not have MyChart, please sign up at [email or web address] MyChart. MyChart will allow you to view the notes including next steps and test results. If you have questions before our next visit, please send me an electronic message though MyChart or call in at the number below. Please note electronic messaging is only for non-urgent questions and response times can vary (3-5 business days).    Bill Salinas, MD  [facility] Gastroenterology and Hepatology Fellow    Important phone numbers:  Nurse Contact: Lytle Butte, RN  p: [phone]    GI Clinic Appointments:  [phone] option 1 then option 1  Please call the GI Clinic appointment line if you need to schedule, reschedule or cancel an appointment in clinic. They can also answer any questions you may have about where your appointment is located and when you need to arrive.      GI Procedure Appointments:  [phone] option 1 then option 2  Please call the GI Procedures line if you need to schedule, reschedule or cancel any type of GI procedure (endoscopy, colonoscopy, motility testing, etc).  You can also call this number for prep instructions, etc.      Radiology: [phone]  If you are being scheduled for any type of radiology study, you will need to call to receive your appointment time.  Please call this number for information.       For emergencies after normal business hours or on weekends/holidays   Proceed to the nearest emergency room OR contact the [facility] Operator at [phone] who can page the Gastroenterology Fellow on call.    Survey  Please complete a survey about your visit today at:  [email or web address]    Or use QR code below to access

## 2021-10-14 NOTE — Unmapped (Signed)
EGD  Procedure #1      Colonoscopy  Procedure #2      [id number]  MRN      GENERIC   Endoscopist      FALSE  Is the patient's health insurance [employer], [facility]), or [employer]?      FALSE  Urgent procedure      FALSE  Do you take: Plavix (clopidogrel), Coumadin (warfarin), Lovenox (enoxaparin), Pradaxa (dabigatran), Effient (prasugrel), Xarelto (rivaroxaban), Eliquis (apixaban), Pletal (cilostazol), or Brilinta (ticagrelor)?    FALSE  Do you have hemophilia, von Willebrand disease, thrombocytopenia?      FALSE  Do you have a pacemaker or implanted cardiac defibrillator?      FALSE  Are you pregnant?      FALSE  Has a [facility] provider specified the location(s)?        Which location(s) did the [facility] provider specify?      FALSE     [facility]      FALSE     [place]      FALSE     [facility]      FALSE     [facility]      FALSE  Is procedure indication for variceal banding (this does NOT include variceal screening)?              5  Height (feet)      4  Height (inches)      222  Weight (pounds)      38.1  BMI              FALSE  Did the ordering provider specify a bowel prep?      NO       What bowel prep was specified?      FALSE  Do you have chronic kidney disease?      FALSE  Do you have chronic constipation or have you had poor quality bowel preps for past colonoscopies?      FALSE  Do you have Crohn's disease or ulcerative colitis?      FALSE  Have you had weight loss surgery?              FALSE  Are you in the process of scheduling or awaiting results of a heart ultrasound, stress test, or catheterization to evaluate new or worsening chest pain, dizziness, or shortness of breath?     FALSE  When you walk around your house or grocery store, do you have to stop and rest due to shortness of breath, chest pain, or light-headedness?      FALSE  Have you had a heart attack, stroke or heart stent placement within the past 6 months?      FALSE  Do you ever use supplemental oxygen?      FALSE  Have you been hospitalized for cirrhosis of the liver or heart failure in the last 12 months?      FALSE  Have you been treated for mouth or throat cancer with radiation or surgery?      FALSE  Have you been told that it is difficult for doctors to insert a breathing tube in you during anesthesia?      FALSE  Have you had a heart or lung transplant?              FALSE  Are you on dialysis?      FALSE  Do you have cirrhosis of the liver?  FALSE  Do you have myasthenia gravis?      FALSE  Is the patient a prisoner?              TRUE  Have you been diagnosed with sleep apnea or do you wear a CPAP machine at night?      FALSE  Are you younger than 30?      FALSE  Have you previously received propofol sedation administered by an anesthesiologist for a GI procedure?      FALSE  Do you drink an average of more than 3 drinks of alcohol per day?      FALSE  Do you regularly take suboxone or any prescription medications for chronic pain?      FALSE  Do you regularly take Ativan, Klonopin, Xanax, Valium, lorazepam, clonazepam, alprazolam, or diazepam?      FALSE  Have you previously had difficulty with sedation during a GI procedure?      FALSE  Have you been diagnosed with PTSD?      FALSE  Are you allergic to fentanyl or midazolam (Versed)?      FALSE  Do you take medications for HIV?      ################### ################################################################################################################### ################ ############### ###############   MRN:          [id number]      Anticoag Review:  No      Nurse Triage:  No      GI Clinic Consult:  No      Procedure(s):  EGD Colonoscopy     [facility]  [place]      Endoscopist:  GENERIC       Urgent:            No       Prep:               Nulytely              ################### ################################################################################################################### ################ ############### ###############

## 2021-10-18 MED FILL — HUMIRA PEN CITRATE FREE 40 MG/0.4 ML: SUBCUTANEOUS | 28 days supply | Qty: 2 | Fill #2

## 2021-11-01 NOTE — Unmapped (Signed)
6/20 left vc msg to schedule 24yr follow up  with new fellow -per Blase Mess 3.27.23 checkout note

## 2021-11-08 NOTE — Unmapped (Signed)
Carol Mendoza reports her psoriasis is greatly cleared up and she's had no new flares. She has a few residual spots - her knee and foot - that she applies a lot of pressure to when she's working, but her hands are clear (previously the worst areas for her). She still uses topical steroids PRN.    I encouraged her to make a follow up visit since we've got one refill remaining and new notes will be needed for PA renewal in Oct.      [facility] Clinical Assessment & Refill Coordination Note    Carol Mendoza, DOB: 10-Nov-1965  Phone: There are no phone numbers on file.    All above HIPAA information was verified with patient.     Was a Nurse, learning disability used for this call? No    Specialty Medication(s):   Inflammatory Disorders: Humira     Current Outpatient Medications   Medication Sig Dispense Refill    acetaminophen (TYLENOL) 500 MG tablet Take 1 tablet (500 mg total) by mouth every four (4) hours as needed for pain.      albuterol HFA 90 mcg/actuation inhaler Inhale 2 puffs every six (6) hours as needed.      amLODIPine (NORVASC) 10 MG tablet Take 2 tablets (20 mg total) by mouth daily.      betamethasone dipropionate (DIPROLENE) 0.05 % ointment Apply topically two (2) times a day as needed. 45 g 2    betamethasone valerate (VALISONE) 0.1 % cream Apply topically Two (2) times a day.      buPROPion (WELLBUTRIN XL) 150 MG 24 hr tablet Take 1 tablet (150 mg total) by mouth daily.      cyclobenzaprine (FLEXERIL) 5 MG tablet Take 1 tablet (5 mg total) by mouth Three (3) times a day as needed for muscle spasms. (Patient not taking: Reported on 10/13/2021) 30 tablet 0    diclofenac sodium (VOLTAREN) 1 % gel Apply 4 g topically four (4) times a day. 100 g 0    FLUoxetine (PROZAC) 20 MG capsule TAKE 1 CAPSULE BY MOUTH EVERY DAY IN THE MORNING      HUMIRA PEN CITRATE FREE 40 MG/0.4 ML Inject the contents of 1 pen (40 mg total) under the skin every fourteen (14) days. 2 each 3    ibuprofen (ADVIL,MOTRIN) 800 MG tablet Take 1 tablet (800 mg total) by mouth every eight (8) hours as needed for pain. (Patient not taking: Reported on 10/13/2021) 30 tablet 0    naproxen sodium (ALEVE) 220 MG tablet Take 1 tablet (220 mg total) by mouth in the morning and 1 tablet (220 mg total) in the evening. Take with meals.      omeprazole (PRILOSEC) 40 MG capsule Take 1 capsule (40 mg total) by mouth two (2) times a day. 360 capsule 1    ondansetron (ZOFRAN) 4 MG tablet Take 1 tablet (4 mg total) by mouth continuous as needed. (Patient not taking: Reported on 10/13/2021)      oxyCODONE-acetaminophen (PERCOCET) 5-325 mg per tablet Take 1 tablet by mouth every four (4) hours as needed. (Patient not taking: Reported on 10/13/2021)       No current facility-administered medications for this visit.        Changes to medications: [name] reports no changes at this time.    Allergies   Allergen Reactions    Septa Itching     Breathing trouble       Changes to allergies: No    SPECIALTY MEDICATION  ADHERENCE     Humira - 0 left  Medication Adherence    Patient reported X missed doses in the last month: 0  Specialty Medication: Humira          Specialty medication(s) dose(s) confirmed: Regimen is correct and unchanged.     Are there any concerns with adherence? No    Adherence counseling provided? Not needed    CLINICAL MANAGEMENT AND INTERVENTION      Clinical Benefit Assessment:    Do you feel the medicine is effective or helping your condition? Yes    Clinical Benefit counseling provided? Not needed    Adverse Effects Assessment:    Are you experiencing any side effects? No    Are you experiencing difficulty administering your medicine? No    Quality of Life Assessment:    Quality of Life    Rheumatology  Oncology  Dermatology  1. What impact has your specialty medication had on the symptoms of your skin condition (i.e. itchiness, soreness, stinging)?: Tremendous  2. What impact has your specialty medication had on your comfort level with your skin?: Tremendous  Cystic Fibrosis          Have you discussed this with your provider? Not needed    Acute Infection Status:    Acute infections noted within Epic:  No active infections  Patient reported infection: None    Therapy Appropriateness:    Is therapy appropriate and patient progressing towards therapeutic goals? Yes, therapy is appropriate and should be continued    DISEASE/MEDICATION-SPECIFIC INFORMATION      For patients on injectable medications: Patient currently has 0 doses left.  Next injection is scheduled for Thurs, 7/6.    PATIENT SPECIFIC NEEDS     Does the patient have any physical, cognitive, or cultural barriers? No    Is the patient high risk? No    Does the patient require a Care Management Plan? No     SHIPPING     Specialty Medication(s) to be Shipped:   Inflammatory Disorders: Humira    Other medication(s) to be shipped: No additional medications requested for fill at this time     Changes to insurance: No    Delivery Scheduled: Yes, Expected medication delivery date: Thurs, 6/29.     Medication will be delivered via Same Day Courier to the confirmed prescription address in [facility].    The patient will receive a drug information handout for each medication shipped and additional FDA Medication Guides as required.  Verified that patient has previously received a Conservation officer, historic buildings and a Surveyor, mining.    The patient or caregiver noted above participated in the development of this care plan and knows that they can request review of or adjustments to the care plan at any time.      All of the patient's questions and concerns have been addressed.    Lanney Gins   [facility] Specialty Pharmacist

## 2021-11-10 DIAGNOSIS — L409 Psoriasis, unspecified: Principal | ICD-10-CM

## 2021-11-10 MED ORDER — HUMIRA PEN CITRATE FREE 40 MG/0.4 ML
SUBCUTANEOUS | 3 refills | 28.00000 days | Status: CP
Start: 2021-11-10 — End: ?
  Filled 2021-11-10: qty 2, 28d supply, fill #3
  Filled 2021-12-12: qty 2, 28d supply, fill #0

## 2021-11-24 DIAGNOSIS — G8929 Other chronic pain: Principal | ICD-10-CM

## 2021-11-24 DIAGNOSIS — M545 Chronic bilateral low back pain without sciatica: Principal | ICD-10-CM

## 2021-11-24 MED ORDER — METHOCARBAMOL 500 MG TABLET
ORAL_TABLET | Freq: Every evening | ORAL | 0 refills | 0 days | PRN
Start: 2021-11-24 — End: ?

## 2021-12-02 MED ORDER — METHOCARBAMOL 500 MG TABLET
ORAL_TABLET | Freq: Every evening | ORAL | 0 refills | 30 days | PRN
Start: 2021-12-02 — End: ?

## 2021-12-02 NOTE — Unmapped (Signed)
Patient has not seen provider since 2022

## 2021-12-12 NOTE — Unmapped (Signed)
[facility] Refill Coordination Note    Specialty Medication(s) to be Shipped:   Inflammatory Disorders: Humira    Other medication(s) to be shipped: No additional medications requested for fill at this time     Carol Mendoza, DOB: Nov 24, 1965  Phone: There are no phone numbers on file.      All above HIPAA information was verified with patient.     Was a Nurse, learning disability used for this call? No    Completed refill call assessment today to schedule patient's medication shipment from the [facility] [phone]).  All relevant notes have been reviewed.     Specialty medication(s) and dose(s) confirmed: Regimen is correct and unchanged.   Changes to medications: Carol Mendoza reports no changes at this time.  Changes to insurance: No  New side effects reported not previously addressed with a pharmacist or physician: None reported  Questions for the pharmacist: No    Confirmed patient received a Conservation officer, historic buildings and a Surveyor, mining with first shipment. The patient will receive a drug information handout for each medication shipped and additional FDA Medication Guides as required.       DISEASE/MEDICATION-SPECIFIC INFORMATION        For patients on injectable medications: Patient currently has 0 doses left.  Next injection is scheduled for 12/15/21.    SPECIALTY MEDICATION ADHERENCE     Medication Adherence    Patient reported X missed doses in the last month: 0  Specialty Medication: Humira  Patient is on additional specialty medications: No  Patient is on more than two specialty medications: No  Informant: patient  Reliability of informant: reliable  Reasons for non-adherence: no problems identified              Were doses missed due to medication being on hold? No    HUMIRA(CF) PEN 40 mg/0.4 mL injection (adalimumab)  : 0 days of medicine on hand        REFERRAL TO PHARMACIST     Referral to the pharmacist: Not needed      [facility]     Shipping address confirmed in Epic. Delivery Scheduled: Yes, Expected medication delivery date: 12/13/21.     Medication will be delivered via Same Day Courier to the prescription address in Epic [facility].    Carol Mendoza' Carol [facility] Specialty Technician

## 2021-12-15 MED ORDER — PEG 3350-ELECTROLYTES 236 GRAM-22.74 GRAM-6.74 GRAM-5.86 GRAM SOLUTION
ORAL | 0 refills | 0 days | Status: CP
Start: 2021-12-15 — End: ?

## 2022-01-03 NOTE — Unmapped (Signed)
[facility] Refill Coordination Note    Specialty Medication(s) to be Shipped:   Inflammatory Disorders: Humira    Other medication(s) to be shipped: No additional medications requested for fill at this time     Shaquayla Zoll, DOB: 1965/10/02  Phone: There are no phone numbers on file.      All above HIPAA information was verified with patient.     Was a Nurse, learning disability used for this call? No    Completed refill call assessment today to schedule patient's medication shipment from the [facility] [phone]).  All relevant notes have been reviewed.     Specialty medication(s) and dose(s) confirmed: Regimen is correct and unchanged.   Changes to medications: Pami reports no changes at this time.  Changes to insurance: No  New side effects reported not previously addressed with a pharmacist or physician: None reported  Questions for the pharmacist: No    Confirmed patient received a Conservation officer, historic buildings and a Surveyor, mining with first shipment. The patient will receive a drug information handout for each medication shipped and additional FDA Medication Guides as required.       DISEASE/MEDICATION-SPECIFIC INFORMATION        For patients on injectable medications: Patient currently has 0 doses left.  Next injection is scheduled for 8/31.    SPECIALTY MEDICATION ADHERENCE     Medication Adherence    Patient reported X missed doses in the last month: 0  Specialty Medication: humira cf pen 40mg /0.70ml                          Were doses missed due to medication being on hold? No    humira cf pen 40mg /0.52ml  : 0 days of medicine on hand       REFERRAL TO PHARMACIST     Referral to the pharmacist: Not needed      [facility]     Shipping address confirmed in Epic.     Delivery Scheduled: Yes, Expected medication delivery date: 8/28.     Medication will be delivered via Same Day Courier to the prescription address in Epic [facility].    Westley Gambles   [facility] Specialty Technician

## 2022-01-09 MED FILL — HUMIRA PEN CITRATE FREE 40 MG/0.4 ML: SUBCUTANEOUS | 28 days supply | Qty: 2 | Fill #1

## 2022-01-18 NOTE — Unmapped (Signed)
Left message on 01/18/22 for Carol Mendoza regarding upcoming GI procedure on 01/23/22 at [facility] location at 0900 with arrival time of 0800. Left number [phone] for return call if there are any questions.

## 2022-02-02 NOTE — Unmapped (Signed)
[facility] Refill Coordination Note    Specialty Medication(s) to be Shipped:   Inflammatory Disorders: Humira    Other medication(s) to be shipped: No additional medications requested for fill at this time     Carol Mendoza, DOB: 1965/09/13  Phone: There are no phone numbers on file.      All above HIPAA information was verified with patient.     Was a Nurse, learning disability used for this call? No    Completed refill call assessment today to schedule patient's medication shipment from the [facility] [phone]).  All relevant notes have been reviewed.     Specialty medication(s) and dose(s) confirmed: Regimen is correct and unchanged.   Changes to medications: Carol Mendoza reports no changes at this time.  Changes to insurance: No  New side effects reported not previously addressed with a pharmacist or physician: None reported  Questions for the pharmacist: No    Confirmed patient received a Conservation officer, historic buildings and a Surveyor, mining with first shipment. The patient will receive a drug information handout for each medication shipped and additional FDA Medication Guides as required.       DISEASE/MEDICATION-SPECIFIC INFORMATION        For patients on injectable medications: Patient currently has 0 doses left.  Next injection is scheduled for 02/09/2022.    SPECIALTY MEDICATION ADHERENCE     Medication Adherence    Patient reported X missed doses in the last month: 0  Specialty Medication: Humira  Patient is on additional specialty medications: No  Any gaps in refill history greater than 2 weeks in the last 3 months: no  Demonstrates understanding of importance of adherence: yes  Informant: patient  Reliability of informant: reliable              Confirmed plan for next specialty medication refill: delivery by pharmacy  Refills needed for supportive medications: not needed              Were doses missed due to medication being on hold? No    humira cf pen 40mg /0.53ml  : 0 days of medicine on hand       REFERRAL TO PHARMACIST     Referral to the pharmacist: Not needed      [facility]     Shipping address confirmed in Epic.     Delivery Scheduled: Yes, Expected medication delivery date: 02/03/2022.     Medication will be delivered via Same Day Courier to the prescription address in Epic [facility].    Carol Mendoza   [facility] Specialty Technician

## 2022-02-03 MED FILL — HUMIRA PEN CITRATE FREE 40 MG/0.4 ML: SUBCUTANEOUS | 28 days supply | Qty: 2 | Fill #2

## 2022-02-13 DIAGNOSIS — L409 Psoriasis, unspecified: Principal | ICD-10-CM

## 2022-02-24 NOTE — Unmapped (Signed)
[facility] Refill Coordination Note    Specialty Medication(s) to be Shipped:   Inflammatory Disorders: Humira    Other medication(s) to be shipped: No additional medications requested for fill at this time     Carol Mendoza, DOB: 05/09/1966  Phone: There are no phone numbers on file.      All above HIPAA information was verified with patient.     Was a Nurse, learning disability used for this call? No    Completed refill call assessment today to schedule patient's medication shipment from the [facility] [phone]).  All relevant notes have been reviewed.     Specialty medication(s) and dose(s) confirmed: Regimen is correct and unchanged.   Changes to medications: [name] reports no changes at this time.  Changes to insurance: No  New side effects reported not previously addressed with a pharmacist or physician: None reported  Questions for the pharmacist: No    Confirmed patient received a Conservation officer, historic buildings and a Surveyor, mining with first shipment. The patient will receive a drug information handout for each medication shipped and additional FDA Medication Guides as required.       DISEASE/MEDICATION-SPECIFIC INFORMATION        For patients on injectable medications: Patient currently has 0 doses left.  Next injection is scheduled for 10/26.    SPECIALTY MEDICATION ADHERENCE     Medication Adherence    Patient reported X missed doses in the last month: 0  Specialty Medication: Humira  Patient is on additional specialty medications: No  Any gaps in refill history greater than 2 weeks in the last 3 months: no  Demonstrates understanding of importance of adherence: yes  Informant: patient  Reliability of informant: reliable              Confirmed plan for next specialty medication refill: delivery by pharmacy  Refills needed for supportive medications: not needed              Were doses missed due to medication being on hold? No    humira cf pen 40mg /0.23ml  : 0 days of medicine on hand       REFERRAL TO PHARMACIST     Referral to the pharmacist: Not needed      [facility]     Shipping address confirmed in Epic.     Delivery Scheduled: Yes, Expected medication delivery date: 10/20.     Medication will be delivered via Same Day Courier to the prescription address in Epic W[facility].    Valere Dross   [facility] Specialty Technician

## 2022-03-03 DIAGNOSIS — L409 Psoriasis, unspecified: Principal | ICD-10-CM

## 2022-03-03 MED ORDER — HUMIRA PEN CITRATE FREE 40 MG/0.4 ML
SUBCUTANEOUS | 3 refills | 28 days
Start: 2022-03-03 — End: ?

## 2022-03-03 MED FILL — HUMIRA PEN CITRATE FREE 40 MG/0.4 ML: SUBCUTANEOUS | 28 days supply | Qty: 2 | Fill #3

## 2022-03-25 NOTE — Unmapped (Signed)
Called and spoke to patient and informed her that an appointment is needed for Humira refills.

## 2022-03-30 MED ORDER — PEG 3350-ELECTROLYTES 236 GRAM-22.74 GRAM-6.74 GRAM-5.86 GRAM SOLUTION
Freq: Once | ORAL | 0 refills | 1 days | Status: CP
Start: 2022-03-30 — End: 2022-03-30

## 2022-03-31 MED ORDER — HUMIRA PEN CITRATE FREE 40 MG/0.4 ML
SUBCUTANEOUS | 3 refills | 28.00000 days
Start: 2022-03-31 — End: ?

## 2022-04-03 NOTE — Unmapped (Signed)
Call from patient on nurse line.LVM  stating she could not have dental surgery today due to being on Humira.  Was told she needs to be off of Humira to have the dental work done.      Patient asked what to expect by going off Humira and how long is a safe time to go off of it.  Also asked how to go off safely.

## 2022-04-05 NOTE — Unmapped (Signed)
  This encounter was created in error - please disregard.  Appointment cancelled. 

## 2022-06-08 NOTE — Unmapped (Signed)
The [facility] has made a second and final attempt to reach this patient to refill the following medication:Humira.      We have left voicemails on the following phone numbers: [phone] .    Dates contacted: 05/31/22 and 06/07/22  Last scheduled delivery: 03/05/23    The patient may be at risk of non-compliance with this medication. The patient should call the [facility] at [phone]  Option 4, then Option 2 (all other specialty patients) to refill medication.    Unk Lightning   [facility] Shared [facility] Specialty Technician

## 2022-07-17 ENCOUNTER — Ambulatory Visit
Admit: 2022-07-17 | Payer: PRIVATE HEALTH INSURANCE | Attending: Student in an Organized Health Care Education/Training Program | Primary: Student in an Organized Health Care Education/Training Program

## 2022-07-17 NOTE — Unmapped (Unsigned)
Assessment/Plan:   Carol Mendoza is a 57 y.o. 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         There are no diagnoses linked to this encounter.    No follow-ups on file.      Patient discussed ***and seen with attending physician, Dr.{[id number]}    I appreciate the opportunity to participate and collaborate in the care of this patient.    Burgess Amor   PGY4 Rheumatology Fellow     History of Present Illness:     Primary Care Provider: Dortha Kern, MD    HPI:  Carol Mendoza is a 57 y.o.  female with history of psoriasis on Humira, HTN who presents for follow up for back pain and arthralgias.    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 Established care with Dr. Blase Mess as a new patient March 2023 for evaluation of lower back pain and arthralgias.     Xray hand:   1.No radiographic evidence of inflammatory or erosive arthropathy involving the hands.   2.Mild bilateral thumb base osteoarthrosis.     Xray L spine:  Mild multilevel degenerative disc disease and facet arthropathy. Normal vertebral alignment.     Last seen: March 2023     Interval History:    Balance of 10 systems was reviewed and is negative except for that mentioned in the HPI.    Review of records: I have reviewed labs/images/clinic notes per the computerized medical record.       Allergies:  Septa    Medications:     Current Outpatient Medications:     acetaminophen (TYLENOL) 500 MG tablet, Take 1 tablet (500 mg total) by mouth every four (4) hours as needed for pain., Disp: , Rfl:     albuterol HFA 90 mcg/actuation inhaler, Inhale 2 puffs every six (6) hours as needed., Disp: , Rfl:     amLODIPine (NORVASC) 10 MG tablet, Take 2 tablets (20 mg total) by mouth daily., Disp: , Rfl:     betamethasone valerate (VALISONE) 0.1 % cream, Apply topically Two (2) times a day., Disp: , Rfl:     buPROPion (WELLBUTRIN XL) 150 MG 24 hr tablet, Take 1 tablet (150 mg total) by mouth daily., Disp: , Rfl:     cyclobenzaprine (FLEXERIL) 5 MG tablet, Take 1 tablet (5 mg total) by mouth Three (3) times a day as needed for muscle spasms., Disp: 30 tablet, Rfl: 0    diclofenac sodium (VOLTAREN) 1 % gel, Apply 4 g topically four (4) times a day., Disp: 100 g, Rfl: 0    FLUoxetine (PROZAC) 20 MG capsule, TAKE 1 CAPSULE BY MOUTH EVERY DAY IN THE MORNING, Disp: , Rfl:     HUMIRA PEN CITRATE FREE 40 MG/0.4 ML, Inject the contents of 1 pen (40 mg total) under the skin every fourteen (14) days., Disp: 2 each, Rfl: 3    ibuprofen (ADVIL,MOTRIN) 800 MG tablet, Take 1 tablet (800 mg total) by mouth every eight (8) hours as needed for pain., Disp: 30 tablet, Rfl: 0    naproxen sodium (ALEVE) 220 MG tablet, Take 1 tablet (220 mg total) by mouth in the morning and 1 tablet (220 mg total) in the evening. Take with meals., Disp: , Rfl:     omeprazole (PRILOSEC) 40 MG capsule, Take 1 capsule (40 mg total) by  mouth two (2) times a day., Disp: 360 capsule, Rfl: 1    ondansetron (ZOFRAN) 4 MG tablet, Take 1 tablet (4 mg total) by mouth continuous as needed., Disp: , Rfl:     oxyCODONE-acetaminophen (PERCOCET) 5-325 mg per tablet, Take 1 tablet by mouth every four (4) hours as needed., Disp: , Rfl:     polyethylene glycol (GOLYTELY) 236-22.74-6.74 gram solution, Take 4,000 mL by mouth Take as directed for 1 dose. Take as directed per [facility] instructions, Disp: 4000 mL, Rfl: 0    Medical History:  Past Medical History:   Diagnosis Date    Arthritis 2015    Depression     Hypertension     Pellucid marginal degeneration of both corneas     Psoriasis 2015    Sleep apnea        Surgical History:  No past surgical history on file.    Social History:  Social History     Tobacco Use    Smoking status: Never     Passive exposure: Past    Smokeless tobacco: Never   Vaping Use    Vaping status: Never Used   Substance Use Topics    Alcohol use: Not Currently    Drug use: Never       Family History:  Family History   Problem Relation Age of Onset    Breast cancer Mother     Cancer Mother         Breast    Diabetes Mother     Diabetes Father     Hypertension Father     Diabetes Brother     Hypertension Brother     Amblyopia Neg Hx     Blindness Neg Hx     Macular degeneration Neg Hx     Retinal detachment Neg Hx     Strabismus Neg Hx     Melanoma Neg Hx     Basal cell carcinoma Neg Hx     Squamous cell carcinoma Neg Hx            Objective   There were no vitals filed for this visit.    Physical Exam:  General: Well-appearing.  HEENT: ***Adequate tear meniscus. Sclera anicteric; PERRL. MMM. ***Adequate sublingual salivary pooling. ***No oral mucosal ulcers.  Pulmonary: Unremarkable respiratory effort. CTAB.  Cardiac: S1 and S2 present, RRR. No murmurs/rubs/gallops.  Neuro: ***ambulation to exam table, *** Moving all four extremities.  Psych: Euthymic affect.  MSK: No overt bony deformities or gross abnormalities in range of motion.   Hands: No swelling or tenderness to palpation of the MCPs, PIPs, or DIPs.  Wrists: No swelling or tenderness to palpation of the wrists or deficit in range of motion.  Elbows: No swelling or tenderness to palpation of the elbows.  Shoulders: No swelling or tenderness to palpation of the bilateral shoulders..  Hips: No swelling or tenderness to palpation of the lateral hips. ***Negative FABER testing.  Knees: No swelling or tenderness to palpation of the bilateral knees.  Ankles: No swelling or tenderness to palpation of the bilateral ankles.  Feet: No swelling or tenderness to palpation of the ankle or MTPs.  Skin: No evidence of erythema or rash.  Lymph: No cervical adenopathy  Extremities: Warm, no LE edema.    Test Results  No results found for: ANA, PAT1, ANATITER1, PAT2, ANATITER2, PAT3, ANATITER3, PAT4, ANATITER4    No results found for: C3, C4, CKTOTAL, CRP, ESR, LDH, DSDNAAB, RF    No results  found for: CREATUR, PROTEINUR, PCRATIOUR    No results found for: EJO1, ERNP, ESCL, ESMA, ESSA, ESSB    Lab Results   Component Value Date    HBSAG Nonreactive 03/09/2021    HEPBSAB Nonreactive 03/09/2021    QFTTBGOLD Negative 03/09/2021    HEPCAB Nonreactive 03/09/2021       Lab Results   Component Value Date    NITRITE Negative 08/27/2021    WBC 8.6 08/08/2021       No results found for: ANCA, ANCAIFA, MPO, MPOQT, PR3ELISA

## 2022-08-03 ENCOUNTER — Emergency Department
Admission: EM | Admit: 2022-08-03 | Discharge: 2022-08-03 | Disposition: A | Discharge status: Home / Self Care | Payer: BLUE CROSS/BLUE SHIELD | Attending: Emergency Medicine | Admitting: Emergency Medicine

## 2022-08-03 ENCOUNTER — Other Ambulatory Visit: Payer: Self-pay

## 2022-08-03 DIAGNOSIS — R220 Localized swelling, mass and lump, head: Secondary | ICD-10-CM | POA: Diagnosis present

## 2022-08-03 DIAGNOSIS — K047 Periapical abscess without sinus: Secondary | ICD-10-CM | POA: Diagnosis not present

## 2022-08-03 MED ORDER — HYDROCODONE-ACETAMINOPHEN 5-325 MG PO TABS: 1.0000 | ORAL_TABLET | Freq: Four times a day (QID) | ORAL | 0 refills | Status: AC | PRN

## 2022-08-03 MED ORDER — LIDOCAINE HCL (PF) 1 % IJ SOLN
5.0000 mL | Freq: Once | INTRAMUSCULAR | Status: AC
  Administered 2022-08-03: 5 mL
  Filled 2022-08-03: qty 5

## 2022-08-03 MED ORDER — CLINDAMYCIN HCL 300 MG PO CAPS: 300.0000 mg | ORAL_CAPSULE | Freq: Three times a day (TID) | ORAL | 0 refills | Status: AC

## 2022-08-03 MED ORDER — CEFTRIAXONE SODIUM 1 G IJ SOLR
1.0000 g | Freq: Once | INTRAMUSCULAR | Status: AC
  Administered 2022-08-03: 1 g via INTRAMUSCULAR
  Filled 2022-08-03: qty 10

## 2022-08-03 MED ORDER — AMOXICILLIN 875 MG PO TABS: 875.0000 mg | ORAL_TABLET | Freq: Two times a day (BID) | ORAL | 0 refills | Status: DC

## 2022-08-03 NOTE — Discharge Instructions (Signed)
Keep your appointment with the oral surgeon on Monday.  Begin taking antibiotics until completely finished.  Take Tylenol or ibuprofen as needed for pain.

## 2022-08-03 NOTE — ED Triage Notes (Signed)
Pt to ED via POV for c/o tooth pain x 3 months and swelling that started yesterday. Rates pain at 10.

## 2022-08-03 NOTE — ED Provider Notes (Signed)
[facility] Provider Note    Event Date/Time   First MD Initiated Contact with Patient 08/03/22 0740     (approximate)   History   Dental Pain   HPI  Veronica Rodgers is a 57 y.o. female   presents to the ED with complaint of dental pain for 3 months and swelling that started yesterday.  Patient reports that she has an appointment with an oral surgeon on Monday.  She also reports that she has been on amoxicillin for 1 month and has finished this.      Physical Exam   Triage Vital Signs: ED Triage Vitals  Enc Vitals Group     BP      Pulse      Resp      Temp      Temp src      SpO2      Weight      Height      Head Circumference      Peak Flow      Pain Score      Pain Loc      Pain Edu?      Excl. in [place]?     Most recent vital signs: Vitals:   08/03/22 0742  BP: 129/65  Pulse: 79  Resp: 16  Temp: 97.9 F (36.6 C)  SpO2: 98%     General: Awake, no distress.  CV:  Good peripheral perfusion.  Resp:  Normal effort.  Abd:  No distention.  Other:  Left lower molar and poor repair and gum edema surrounding the tooth.  No active drainage.  Left lower cheek with soft tissue edema.   ED Results / Procedures / Treatments   Labs (all labs ordered are listed, but only abnormal results are displayed) Labs Reviewed - No data to display    PROCEDURES:  Critical Care performed:   Procedures   MEDICATIONS ORDERED IN ED: Medications  cefTRIAXone (ROCEPHIN) injection 1 g (1 g Intramuscular Given 08/03/22 0835)  lidocaine (PF) (XYLOCAINE) 1 % injection 5 mL (5 mLs Infiltration Given 08/03/22 0838)     IMPRESSION / MDM / ASSESSMENT AND PLAN / ED COURSE  I reviewed the triage vital signs and the nursing notes.   Differential diagnosis includes, but is not limited to, dental injury, dental abscess, gingivitis, pharyngitis.  57 year old female presents to the ED with complaint of dental pain for 3 months and swelling that started  yesterday.  She has already been on amoxicillin per her history.  Patient was given Rocephin 1 g IM while in the ED and a prescription for clindamycin and hydrocodone was sent to the pharmacy.  Patient is strongly urged to keep her appointment on Monday.      Patient's presentation is most consistent with acute, uncomplicated illness.  FINAL CLINICAL IMPRESSION(S) / ED DIAGNOSES   Final diagnoses:  Dental abscess     Rx / DC Orders   ED Discharge Orders          Ordered    amoxicillin (AMOXIL) 875 MG tablet  2 times daily,   Status:  Discontinued        08/03/22 0756    clindamycin (CLEOCIN) 300 MG capsule  3 times daily        08/03/22 0814    HYDROcodone-acetaminophen (NORCO/VICODIN) 5-325 MG tablet  Every 6 hours PRN        08/03/22 [id number]  Note:  This document was prepared using Dragon voice recognition software and may include unintentional dictation errors.   Johnn Hai, PA-C 08/03/22 1050    Blake Divine, MD 08/03/22 [phone]

## 2022-09-06 NOTE — Unmapped (Signed)
Specialty Medication(s): Humira    Carol Mendoza has been dis-enrolled from the [facility] specialty pharmacy services due to multiple unsuccessful outreach attempts by the pharmacy.    Additional information provided to the patient: n/a    Arnold Long, PharmD  [facility] Specialty Pharmacist

## 2023-06-17 IMAGING — MG DIGITAL DIAGNOSTIC BILAT W/ TOMO W/ CAD
6 of 10 series · 6 of 30 positions shown · non-contrast
Comparison: Previous exam(s).

ACR Breast Density Category a: The breast tissue is almost entirely
fatty.

CLINICAL DATA: 55-year-old female presenting for evaluation of
focal pain in the inferior posterior right breast for several years.
She has family history of breast cancer in her mother diagnosed in
her 60s.

EXAM:
DIGITAL DIAGNOSTIC BILATERAL MAMMOGRAM WITH TOMOSYNTHESIS AND CAD;
ULTRASOUND RIGHT BREAST LIMITED
TECHNIQUE: Bilateral digital diagnostic mammography and breast tomosynthesis
was performed. The images were evaluated with computer-aided
detection.; Targeted ultrasound examination of the right breast was
performed

[R TAN synth-2D]
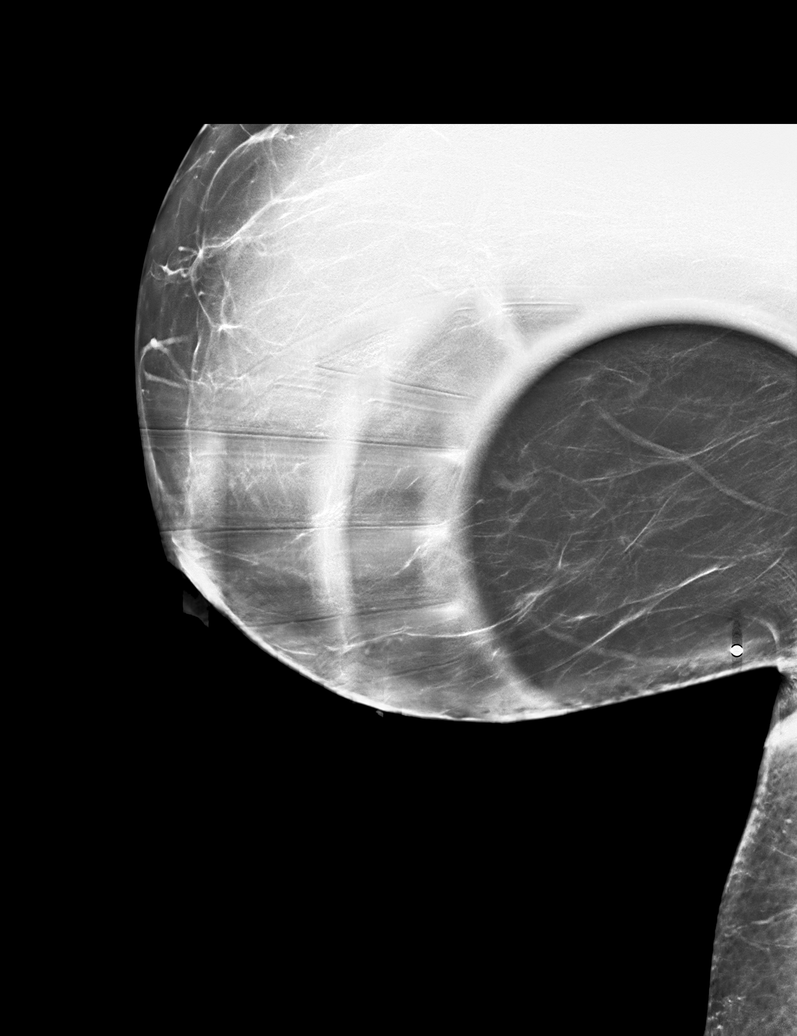

[L CC synth-2D]
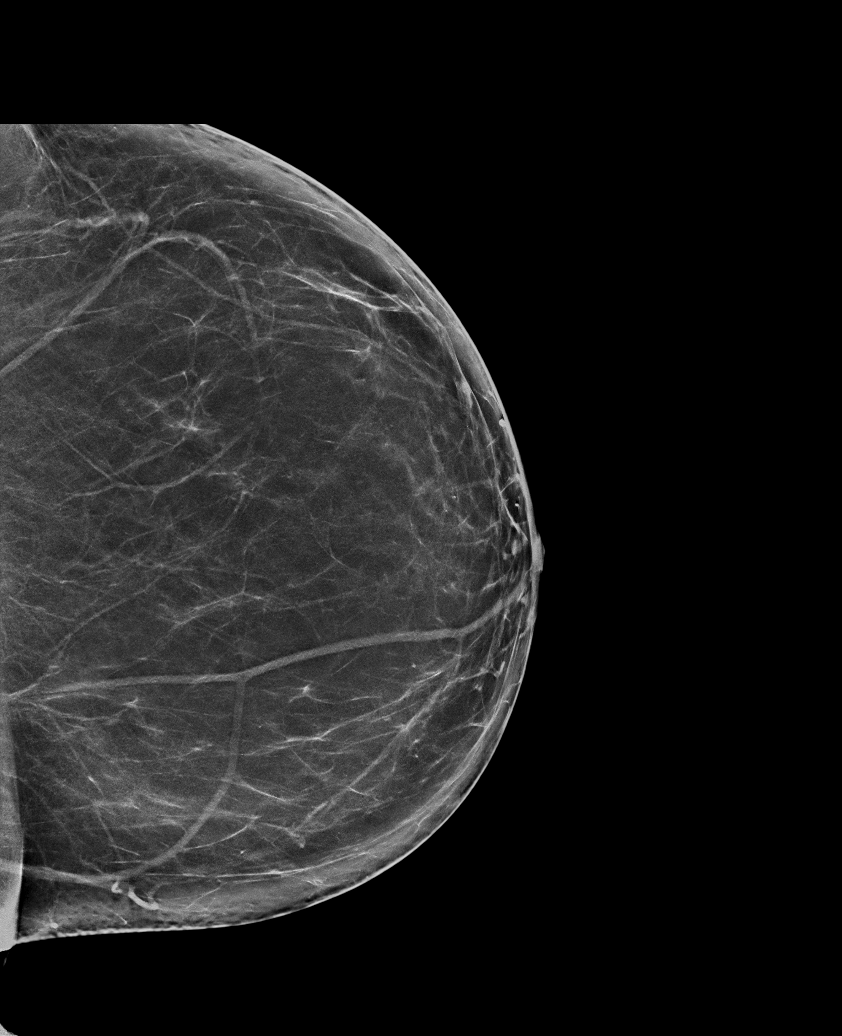

[R CC synth-2D]
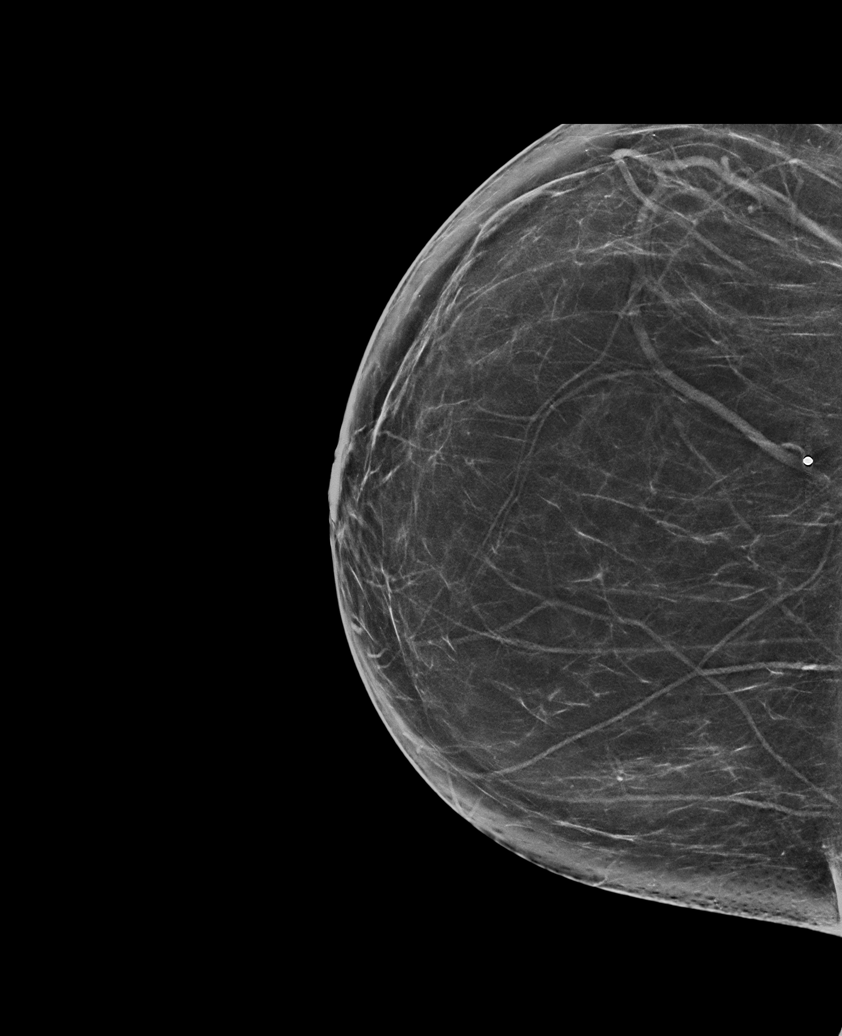

[L MLO synth-2D]
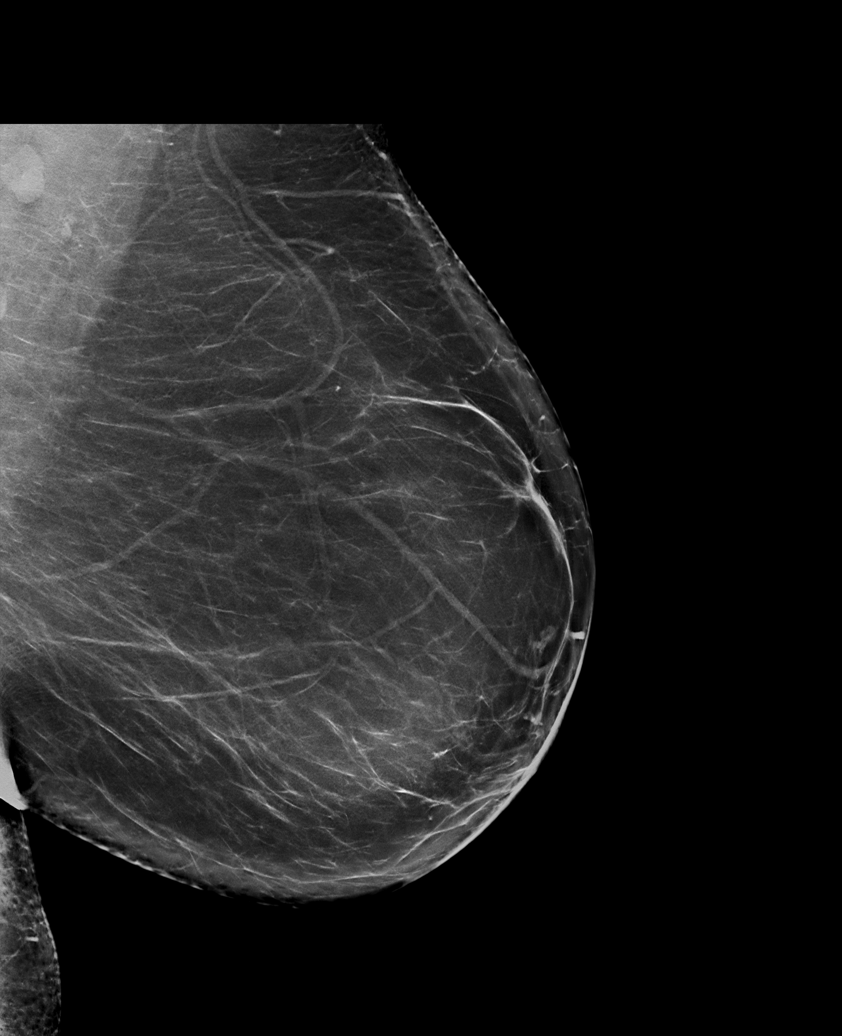

[R MLO synth-2D]
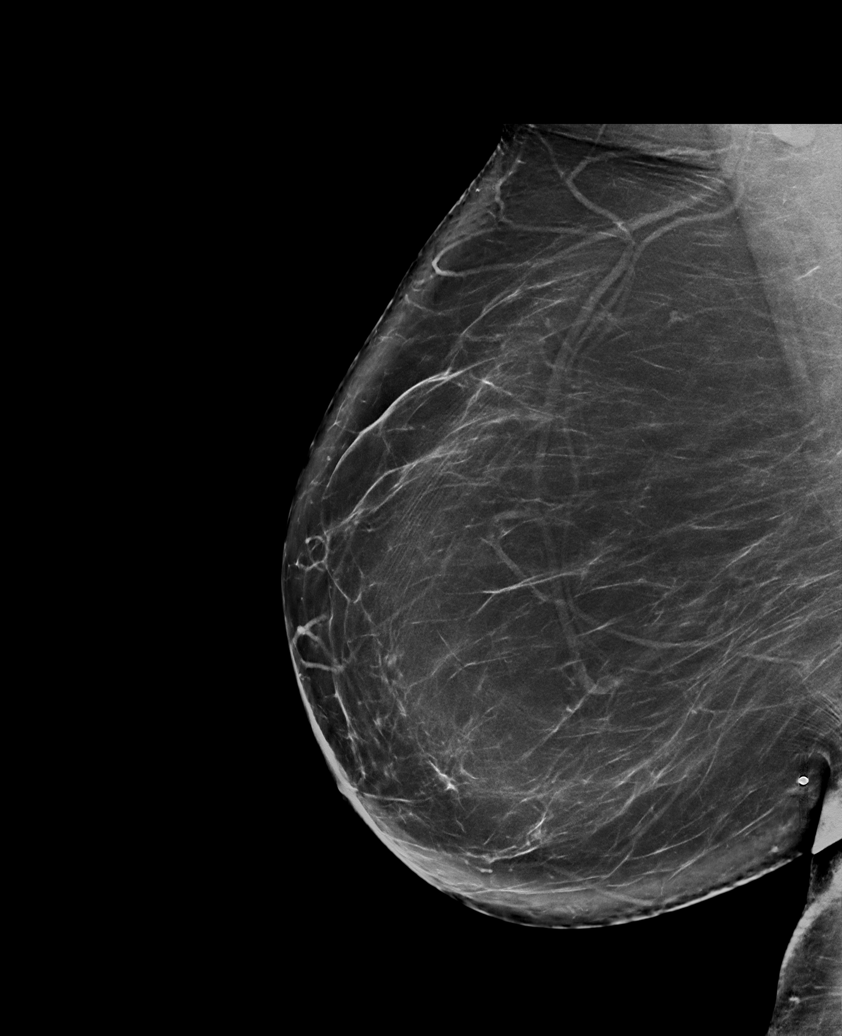

[R TAN tomo · tomo slice 39/78.0]
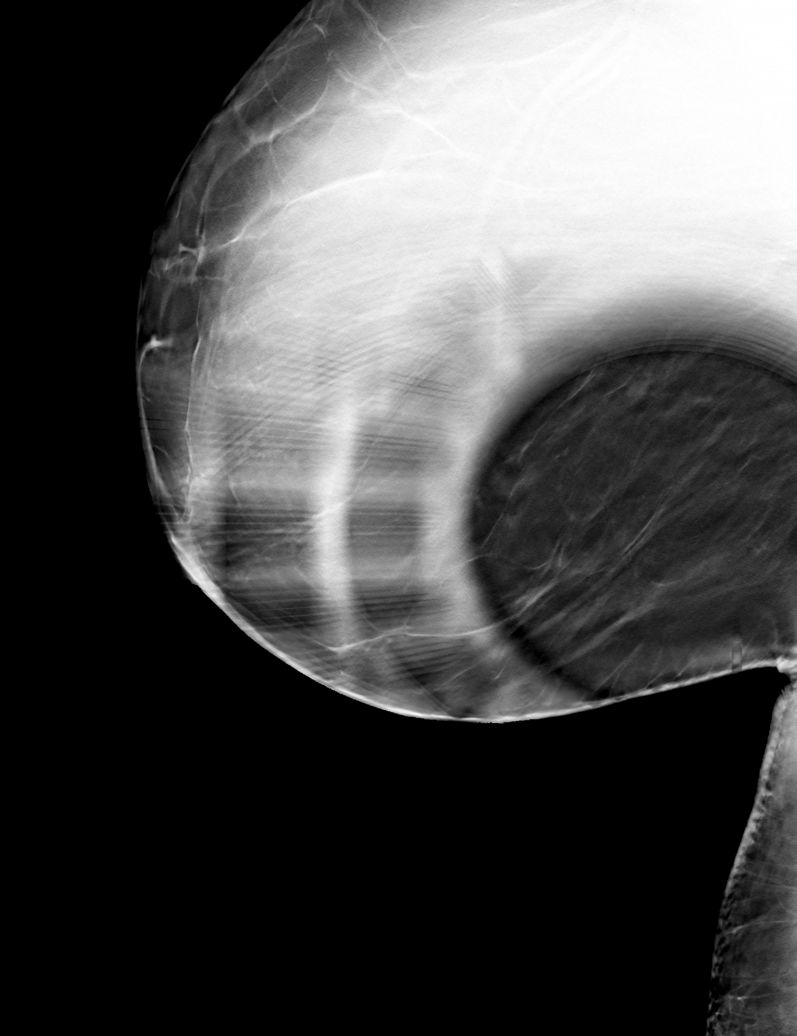

[6 of 30 positions shown; findings below may reference images not displayed]

FINDINGS: No suspicious calcifications, masses or areas of distortion are seen
in the bilateral breasts. No abnormal mammographic findings are
identified deep to the skin marker in the inferior right breast.

Ultrasound targeted to the site of focal tenderness in the inferior
right breast demonstrates normal fibroglandular tissue. No
suspicious masses or areas of shadowing are identified.
IMPRESSION: 1. There are no suspicious mammographic or targeted sonographic
abnormalities at the site of focal pain in the inferior right
breast.

2.  No mammographic evidence of malignancy in the bilateral breasts.

RECOMMENDATION:
1. Clinical follow-up recommended for the tender area of concern in
the inferior right breast. Any further workup should be based on
clinical grounds.

2.  Screening mammogram in one year.(Code:[id number])

I have discussed the findings and recommendations with the patient.
If applicable, a reminder letter will be sent to the patient
regarding the next appointment.

BI-RADS CATEGORY  1: Negative.

## 2024-05-11 ENCOUNTER — Emergency Department
Admit: 2024-05-11 | Discharge: 2024-05-11 | Disposition: A | Discharge status: Home / Self Care | Payer: BLUE CROSS/BLUE SHIELD

## 2024-05-11 MED ORDER — OXYCODONE-ACETAMINOPHEN 5 MG-325 MG TABLET
ORAL_TABLET | ORAL | 0 refills | 2.00000 days | Status: CP | PRN
Start: 2024-05-11 — End: 2024-05-16

## 2024-06-05 DIAGNOSIS — Z1231 Encounter for screening mammogram for malignant neoplasm of breast: Principal | ICD-10-CM
